# Patient Record
Sex: Male | Born: 1970 | Race: White | Hispanic: No | Marital: Married
Health system: Midwestern US, Community
[De-identification: ages and names within clinical notes are randomized; demographics above are authoritative.]

## PROBLEM LIST (undated history)

## (undated) DIAGNOSIS — F419 Anxiety disorder, unspecified: Secondary | ICD-10-CM

## (undated) DIAGNOSIS — I10 Essential (primary) hypertension: Secondary | ICD-10-CM

## (undated) DIAGNOSIS — G4733 Obstructive sleep apnea (adult) (pediatric): Secondary | ICD-10-CM

## (undated) DIAGNOSIS — R058 Other specified cough: Secondary | ICD-10-CM

## (undated) DIAGNOSIS — R600 Localized edema: Secondary | ICD-10-CM

## (undated) HISTORY — DX: Essential (primary) hypertension: I10

## (undated) HISTORY — DX: Anxiety disorder, unspecified: F41.9

---

## 1997-08-28 ENCOUNTER — Emergency Department (HOSPITAL_COMMUNITY): Admission: EM | Admit: 1997-08-28 | Discharge: 1997-08-28 | Payer: Self-pay

## 2000-01-31 ENCOUNTER — Emergency Department (HOSPITAL_COMMUNITY): Admission: EM | Admit: 2000-01-31 | Discharge: 2000-01-31 | Payer: Self-pay | Admitting: Emergency Medicine

## 2000-06-05 ENCOUNTER — Ambulatory Visit (HOSPITAL_BASED_OUTPATIENT_CLINIC_OR_DEPARTMENT_OTHER): Admission: RE | Admit: 2000-06-05 | Discharge: 2000-06-05 | Payer: Self-pay | Admitting: Surgery

## 2003-11-30 ENCOUNTER — Ambulatory Visit (HOSPITAL_BASED_OUTPATIENT_CLINIC_OR_DEPARTMENT_OTHER): Admission: RE | Admit: 2003-11-30 | Discharge: 2003-11-30 | Payer: Self-pay | Admitting: Otolaryngology

## 2011-04-13 MED ORDER — LISINOPRIL-HYDROCHLOROTHIAZIDE 20 MG-12.5 MG TAB
ORAL_TABLET | Freq: Every day | ORAL | Status: DC
Start: 2011-04-13 — End: 2011-06-08

## 2011-04-13 NOTE — Patient Instructions (Addendum)
[facility]  Blood pressure machine.        DASH Diet: After Your Visit  Your Care Instructions  The DASH diet is an eating plan that can help lower your blood pressure. DASH stands for Dietary Approaches to Stop Hypertension. Hypertension is high blood pressure.  The DASH diet focuses on eating foods that are high in calcium, potassium, and magnesium. These nutrients can lower blood pressure. The foods that are highest in these nutrients are fruits, vegetables, low-fat dairy products, nuts, seeds, and legumes. But taking calcium, potassium, and magnesium supplements instead of eating foods that are high in those nutrients does not have the same effect. The DASH diet also includes whole grains, fish, and poultry.  The DASH diet is one of several lifestyle changes your doctor may recommend to lower your high blood pressure. Your doctor may also want you to decrease the amount of sodium in your diet. Lowering sodium while following the DASH diet can lower blood pressure even further than just the DASH diet alone.  Follow-up care is a key part of your treatment and safety. Be sure to make and go to all appointments, and call your doctor if you are having problems. It???s also a good idea to know your test results and keep a list of the medicines you take.  How can you care for yourself at home?  Following the DASH diet  ?? Eat 4 to 5 servings of fruit each day. A serving is 1 medium-sized piece of fruit, ?? cup chopped or canned fruit, 1/4 cup dried fruit, or 4 ounces (?? cup) of fruit juice. Choose fruit more often than fruit juice.   ?? Eat 4 to 5 servings of vegetables each day. A serving is 1 cup of lettuce or raw leafy vegetables, ?? cup of chopped or cooked vegetables, or 4 ounces (?? cup) of vegetable juice. Choose vegetables more often than vegetable juice.   ?? Get 2 to 3 servings of low-fat and fat-free dairy each day. A serving is 8 ounces of milk, 1 cup of yogurt, or 1 ?? ounces of cheese.   ?? Eat 7 to 8 servings of grains  each day. A serving is 1 slice of bread, 1 ounce of dry cereal, or ?? cup of cooked rice, pasta, or cooked cereal. Try to choose whole-grain products as much as possible.   ?? Limit lean meat, poultry, and fish to 6 ounces each day. Six ounces is about the size of two decks of cards.   ?? Eat 4 to 5 servings of nuts, seeds, and legumes (cooked dried beans, lentils, and split peas) each week. A serving is 1/3 cup of nuts, 2 tablespoons of seeds, or ?? cup cooked dried beans or peas.   ?? Limit sweets and added sugars to 5 servings or less a week. A serving is 1 tablespoon jelly or jam, ?? cup sorbet, or 1 cup of lemonade.   Tips for success  ?? Start small. Do not try to make dramatic changes to your diet all at once. You might feel that you are missing out on your favorite foods and then be more likely to not follow the plan. Make small changes, and stick with them. Once those changes become habit, add a few more changes.   ?? Try some of the following:   ?? Make it a goal to eat a fruit or vegetable at every meal and at snacks. This will make it easy to get the recommended amount of  fruits and vegetables each day.   ?? Try yogurt topped with fruit and nuts for a snack or healthy dessert.   ?? Add lettuce, tomato, cucumber, and onion to sandwiches.   ?? Combine a ready-made pizza crust with low-fat mozzarella cheese and lots of vegetable toppings. Try using tomatoes, squash, spinach, broccoli, carrots, cauliflower, and onions.   ?? Have a variety of cut-up vegetables with a low-fat dip as an appetizer instead of chips and dip.   ?? Sprinkle sunflower seeds or chopped almonds over salads. Or try adding chopped walnuts or almonds to cooked vegetables.   ?? Try some vegetarian meals using beans and peas. Add garbanzo or kidney beans to salads. Make burritos and tacos with mashed pinto beans or black beans.     Where can you learn more?    Go to [email or web address]   Enter H967 in the search box to learn more about  "DASH Diet: After Your Visit."    ?? 2006-2013 [employer]. Care instructions adapted under license by [employer] (which disclaims liability or warranty for this information). This care instruction is for use with your licensed healthcare professional. If you have questions about a medical condition or this instruction, always ask your healthcare professional. [employer] disclaims any warranty or liability for your use of this information.  Content Version: 9.6.101520; Last Revised: April 07, 2010          High Blood Pressure: After Your Visit  Your Care Instructions  If your blood pressure is usually above 140/90, you have high blood pressure, or hypertension. Despite what a lot of people think, high blood pressure usually doesn't cause headaches or make you feel dizzy or lightheaded. It usually has no symptoms. But it does increase your risk for heart attack, stroke, and kidney or eye damage. The higher your blood pressure, the more your risk increases.  Your doctor will give you a goal for your blood pressure. This goal may be below 140/90. Or it may be even lower if you have other health problems, such as diabetes, heart failure, or coronary artery disease.  Changes in your lifestyle, such as staying at a healthy weight, may help you lower your blood pressure. Your treatment also will include medicine. If you stop taking your medicine, your blood pressure will go back up.  Follow-up care is a key part of your treatment and safety. Be sure to make and go to all appointments, and call your doctor if you are having problems. It???s also a good idea to know your test results and keep a list of the medicines you take.  How can you care for yourself at home?  Medical treatment  ?? Take your medicine exactly as prescribed. You may take one or more types of medicine to lower your blood pressure. They include diuretics, beta-blockers, ACE inhibitors, calcium channel blockers, angiotensin II  receptor blockers, and other medicines. Call your doctor if you think you are having a problem with your medicine.   ?? Your doctor may suggest that you take one low-dose aspirin (81 mg) a day. This can help reduce your risk of having a stroke or heart attack.   ?? See your doctor at least 2 times a year. You may need to see the doctor more often at first or until your blood pressure comes down.   ?? If you are taking blood pressure medicine, talk to your doctor before you take decongestants or anti-inflammatory medicine, such as ibuprofen. Some of these medicines  can raise blood pressure.   ?? Learn how to check your blood pressure at home.   Lifestyle changes  ?? Stay at a healthy weight. This is especially important if you put on weight around the waist. Losing even 10 pounds can help you lower your blood pressure.   ?? If your doctor recommends it, get more exercise. Walking is a good choice. Bit by bit, increase the amount you walk every day. Try for at least 30 minutes on most days of the week. You also may want to swim, bike, or do other activities.   ?? Avoid or limit alcohol. Talk to your doctor about whether you can drink any alcohol.   ?? Limit salt.   ?? Eat plenty of fruits (such as bananas and oranges), vegetables, legumes, whole grains, and low-fat dairy products.   ?? Lower the amount of saturated fat in your diet. Saturated fat is found in animal products such as milk, cheese, and meat. Limiting these foods may help you lose weight and also lower your risk for heart disease.   ?? Do not smoke. Smoking increases your risk for heart attack and stroke. If you need help quitting, talk to your doctor about stop-smoking programs and medicines. These can increase your chances of quitting for good.   When should you call for help?  Call your doctor now or seek immediate medical care if:  ?? Your blood pressure is much higher than normal (such as 180/110 or higher).   ?? You think high blood pressure is causing symptoms  such as:   ?? Severe headache.   ?? Blurry vision.   ?? Nausea or vomiting.   Watch closely for changes in your health, and be sure to contact your doctor if:  ?? You do not get better as expected.     Where can you learn more?    Go to [email or web address]   Enter [id number] in the search box to learn more about "High Blood Pressure: After Your Visit."    ?? 2006-2013 [employer]. Care instructions adapted under license by [employer] (which disclaims liability or warranty for this information). This care instruction is for use with your licensed healthcare professional. If you have questions about a medical condition or this instruction, always ask your healthcare professional. [employer] disclaims any warranty or liability for your use of this information.  Content Version: 9.6.101520; Last Revised: May 18, 2009

## 2011-04-13 NOTE — Progress Notes (Signed)
Establish care/patient had uvulectomy surgery

## 2011-04-13 NOTE — Progress Notes (Signed)
HISTORY OF PRESENT ILLNESS  Daryl Morgan is a 41 y.o. male.  HPI  new to my practice  Transferring care from Dr. [place].  Last evaluated there 12 months ago.    Hypertension:  Daryl Morgan is a 41 y.o. male with hypertension.    Medication change since last visit: Yes: Comment: started lisinopril/hct  The patient reports:  taking medications as instructed, no medication side effects noted, patient does not perform home BP monitoring, no chest pain on exertion, no dyspnea on exertion, no swelling of ankles, no orthostatic dizziness or lightheadedness, no palpitations.       Lifestyle modification/social history: sedentary    No results found for this basename: NA, K, CL, CO2, AGAP, GLU, BUN, CREA, BUCR, GFRAA, GFRNA, CA, KP, CLP, CO2P, GLUC, BUNPL, CREAP, GFRAA, CAPL       Anxiety history - on citalopram for panic disorder.  Working well.  No side effects.      Patient Active Problem List   Diagnoses Code   ??? HTN (hypertension) 401.9   ??? Anxiety 300.00     Past Surgical History   Procedure Date   ??? Hx uvulopalatopharyngoplasty    ??? Hx uvulopalatopharyngoplasty    ??? Hx heent      uvuloectomy     History     Social History   ??? Marital Status: MARRIED     Spouse Name: N/A     Number of Children: N/A   ??? Years of Education: N/A     Occupational History   ??? Not on file.     Social History Main Topics   ??? Smoking status: Current Some Day Smoker     Types: Cigarettes, Cigars   ??? Smokeless tobacco: Never Used    Comment: 1/pp week   ??? Alcohol Use: 5.0 oz/week     10 Shots of liquor per week   ??? Drug Use: Yes     Special: Hallucinogenics, Marijuana, Cocaine      NO IVDA,  drugs in past only   ??? Sexually Active: Yes -- Male partner(s)     Other Topics Concern   ??? Not on file     Social History Narrative   ??? No narrative on file     Family History   Problem Relation Age of Onset   ??? Cancer Maternal Grandmother    ??? Diabetes Neg Hx    ??? Hypertension Neg Hx      Current Outpatient Prescriptions   Medication Sig   ???  citalopram (CELEXA) 20 mg tablet Take  by mouth daily.   ??? lisinopril-hydrochlorothiazide (PRINZIDE, ZESTORETIC) 20-12.5 mg per tablet Take 2 Tabs by mouth daily.   ??? DISCONTD: lisinopril-hydrochlorothiazide (PRINZIDE, ZESTORETIC) 10-12.5 mg per tablet Take  by mouth daily.   ??? DISCONTD: lisinopril-hydrochlorothiazide (PRINZIDE, ZESTORETIC) 20-12.5 mg per tablet Take  by mouth daily.     No Known Allergies      Review of Systems   Constitutional: Negative for malaise/fatigue.   Respiratory: Negative for shortness of breath.    Cardiovascular: Negative for chest pain, palpitations and leg swelling.   Gastrointestinal: Negative.    Musculoskeletal: Negative for myalgias.        No Muscle cramping   Neurological: Negative for dizziness, focal weakness, loss of consciousness, weakness and headaches.   Endo/Heme/Allergies: Negative for environmental allergies.   Psychiatric/Behavioral: Negative for depression, suicidal ideas and substance abuse. The patient is not nervous/anxious.        Physical  Exam   Nursing note and vitals reviewed.  Constitutional: He appears well-developed and well-nourished. No distress.        BP 146/110   Pulse 70   Temp(Src) 98.9 ??F (37.2 ??C) (Oral)   Resp 20   Ht 5' 10.75" (1.797 m)   Wt 286 lb 3.2 oz (129.819 kg)   BMI 40.20 kg/m2   SpO2 97%Body mass index is 40.20 kg/(m^2).   HENT:   Mouth/Throat: Oropharynx is clear and moist.   Neck: No JVD present. Carotid bruit is not present.   Cardiovascular: Normal rate, regular rhythm, normal heart sounds and intact distal pulses.    Pulmonary/Chest: Effort normal and breath sounds normal.   Musculoskeletal: He exhibits no edema.   Neurological: He is alert.   Skin: Skin is warm and dry. He is not diaphoretic.   Psychiatric: He has a normal mood and affect. His behavior is normal. Judgment and thought content normal.     Repeat vitals were done by me and were:  BP 148/100   Pulse 70   Temp(Src) 98.9 ??F (37.2 ??C) (Oral)   Resp 20   Ht 5' 10.75" (1.797  m)   Wt 286 lb 3.2 oz (129.819 kg)   BMI 40.20 kg/m2   SpO2 97%      ASSESSMENT and PLAN  Nana was seen today for establish care.    Diagnoses and associated orders for this visit:    Hypertension, uncontrolled- increase ACE/ thiazide now.   Log blood pressures at home 3-5 times weekly and bring to next visit.  Call office as soon as possible if BP's over 150/100 on multiple occasions or with symptoms of dizziness, chest pain, shortness of breath, headache or ankle swelling.    - Discontinue: lisinopril-hydrochlorothiazide (PRINZIDE, ZESTORETIC) 20-12.5 mg per tablet; Take  by mouth daily.  - lisinopril-hydrochlorothiazide (PRINZIDE, ZESTORETIC) 20-12.5 mg per tablet; Take 2 Tabs by mouth daily.  - METABOLIC PANEL, BASIC  The patient was given written instructions detailing his diagnoses and recommendations made today at the visit.    Anxiety- great control- no changes.  - citalopram (CELEXA) 20 mg tablet; Take  by mouth daily.    Other Orders  - Discontinue: lisinopril-hydrochlorothiazide (PRINZIDE, ZESTORETIC) 10-12.5 mg per tablet; Take  by mouth daily.    The patient is advised to quit smoking, begin progressive daily aerobic exercise program, follow a low fat, low cholesterol diet, attempt to lose weight and reduce salt in diet and cooking.      Follow-up Disposition:  Return in about 4 weeks (around 05/11/2011).  lab results and schedule of future lab studies reviewed with patient  reviewed diet, exercise and weight control  very strongly urged to quit smoking to reduce cardiovascular risk  reviewed medications and side effects in detail

## 2011-04-14 LAB — METABOLIC PANEL, BASIC
BUN/Creatinine ratio: 13 (ref 9–20)
BUN: 12 mg/dL (ref 6–24)
CO2: 24 mmol/L (ref 20–32)
Calcium: 9.9 mg/dL (ref 8.7–10.2)
Chloride: 104 mmol/L (ref 97–108)
Creatinine: 0.9 mg/dL (ref 0.76–1.27)
GFR est non-AA: 105 mL/min/{1.73_m2} (ref >59)
Glucose: 76 mg/dL (ref 65–99)
Potassium: 4.3 mmol/L (ref 3.5–5.2)
Sodium: 140 mmol/L (ref 134–144)
eGFR If African American: 121 mL/min/{1.73_m2} (ref >59)

## 2011-05-11 MED ORDER — AMLODIPINE 5 MG TAB
5 mg | ORAL_TABLET | Freq: Every day | ORAL | Status: DC
Start: 2011-05-11 — End: 2011-06-08

## 2011-05-11 NOTE — Progress Notes (Signed)
HISTORY OF PRESENT ILLNESS  Daryl Morgan is a 41 y.o. male.  HPI  Hypertension:  Daryl Morgan is a 42 y.o. male with hypertension.    Medication change since last visit: Yes: Comment: increased lisinopril and hctz  The patient reports:  taking medications as instructed, no medication side effects noted, patient does not perform home BP monitoring, no TIA's, no chest pain on exertion, no dyspnea on exertion, no swelling of ankles, no orthostatic dizziness or lightheadedness, no palpitations.       Lifestyle modification/social history: not attempting to follow a low fat, low cholesterol diet, sedentary, nonsmoker    Lab Results   Component Value Date/Time    Sodium 140 04/13/2011 12:00 AM    Potassium 4.3 04/13/2011 12:00 AM    Chloride 104 04/13/2011 12:00 AM    CO2 24 04/13/2011 12:00 AM    Glucose 76 04/13/2011 12:00 AM    BUN 12 04/13/2011 12:00 AM    Creatinine 0.90 04/13/2011 12:00 AM    BUN/Creatinine ratio 13 04/13/2011 12:00 AM    GFR est non-AA 105 04/13/2011 12:00 AM    Calcium 9.9 04/13/2011 12:00 AM             ROS    Physical Exam   Nursing note and vitals reviewed.  Constitutional: He appears well-developed and well-nourished. No distress.        BP 135/100   Pulse 80   Temp(Src) 98.8 ??F (37.1 ??C) (Oral)   Resp 20   Ht 5' 10.7" (1.796 m)   Wt 291 lb 9.6 oz (132.269 kg)   BMI 41.02 kg/m2   SpO2 96%Body mass index is 41.02 kg/(m^2).   HENT:   Mouth/Throat: Oropharynx is clear and moist.   Neck: No JVD present. Carotid bruit is not present.   Cardiovascular: Normal rate, regular rhythm, normal heart sounds and intact distal pulses.    Pulmonary/Chest: Effort normal and breath sounds normal.   Musculoskeletal: He exhibits no edema.   Neurological: He is alert.   Skin: Skin is warm and dry. He is not diaphoretic.       ASSESSMENT and PLAN  Daryl Morgan was seen today for hypertension.    Diagnoses and associated orders for this visit:    Uncontrolled hypertension- add CCB  - amLODIPine (NORVASC) 5 mg tablet; Take 1 Tab by  mouth daily.  The patient is advised to begin progressive daily aerobic exercise program, follow a low fat, low cholesterol diet and attempt to lose weight.        Follow-up Disposition:  Return in about 4 weeks (around 06/08/2011).  lab results and schedule of future lab studies reviewed with patient

## 2011-06-08 MED ORDER — AMLODIPINE 5 MG TAB
5 mg | ORAL_TABLET | Freq: Every day | ORAL | Status: DC
Start: 2011-06-08 — End: 2012-01-09

## 2011-06-08 MED ORDER — LISINOPRIL-HYDROCHLOROTHIAZIDE 20 MG-12.5 MG TAB
ORAL_TABLET | Freq: Every day | ORAL | Status: DC
Start: 2011-06-08 — End: 2012-01-09

## 2011-06-08 NOTE — Progress Notes (Signed)
HISTORY OF PRESENT ILLNESS  Daryl Morgan is a 41 y.o. male.  HPI  Hypertension:  Daryl Morgan is a 41 y.o. male with hypertension.    Medication change since last visit: Yes: Comment: added amlodipine  The patient reports:  taking medications as instructed, no medication side effects noted, patient does not perform home BP monitoring, no TIA's, no chest pain on exertion, no dyspnea on exertion, no swelling of ankles, no orthostatic dizziness or lightheadedness, no palpitations.       Lifestyle modification/social history: generally follows a low fat low cholesterol diet, generally follows a low sodium diet, sedentary    Lab Results   Component Value Date/Time    Sodium 140 04/13/2011 12:00 AM    Potassium 4.3 04/13/2011 12:00 AM    Chloride 104 04/13/2011 12:00 AM    CO2 24 04/13/2011 12:00 AM    Glucose 76 04/13/2011 12:00 AM    BUN 12 04/13/2011 12:00 AM    Creatinine 0.90 04/13/2011 12:00 AM    BUN/Creatinine ratio 13 04/13/2011 12:00 AM    GFR est non-AA 105 04/13/2011 12:00 AM    Calcium 9.9 04/13/2011 12:00 AM       Anxiety :  Patient is seen for anxiety disorder. Current treatment includes Celexa and no other therapies. Reported side effects from the medication: none.  Ongoing symptoms include: none.   he does feel treatment is effective.  Patient denies: none.  Depression is not associated with his anxiety.        ROS    Physical Exam  BP 126/86   Temp 98.9 ??F (37.2 ??C)   Resp 18   Ht 6' (1.829 m)   Wt 289 lb (131.09 kg)   BMI 39.20 kg/m2   SpO2 99%    ASSESSMENT and PLAN  Daryl Morgan was seen today for follow-up and blood pressure check.    Diagnoses and associated orders for this visit:    Htn (hypertension)- much better control.   Log blood pressures at home 3-5 times weekly and bring to next visit.  Call office as soon as possible if BP's over 150/100 on multiple occasions or with symptoms of dizziness, chest pain, shortness of breath, headache or ankle swelling.    - LIPID PANEL  - amLODIPine (NORVASC) 5 mg tablet; Take  1 Tab by mouth daily.  - lisinopril-hydrochlorothiazide (PRINZIDE, ZESTORETIC) 20-12.5 mg per tablet; Take 2 Tabs by mouth daily.  The patient is advised to begin progressive daily aerobic exercise program.    Anxiety- reported good control with SSRI.  No changes.      Follow-up Disposition:  Return in about 4 months (around 10/09/2011).  lab results and schedule of future lab studies reviewed with patient  reviewed medications and side effects in detail

## 2011-06-09 LAB — LIPID PANEL
Cholesterol, total: 170 mg/dL (ref 100–199)
HDL Cholesterol: 45 mg/dL (ref >39)
LDL, calculated: 95 mg/dL (ref 0–99)
Triglyceride: 152 mg/dL — ABNORMAL HIGH (ref 0–149)
VLDL, calculated: 30 mg/dL (ref 5–40)

## 2011-10-18 NOTE — Progress Notes (Signed)
Patient here for F/up for HTN/eye problem.

## 2011-10-18 NOTE — Progress Notes (Signed)
HISTORY OF PRESENT ILLNESS  Daryl Morgan is a 41 y.o. male.  HPI  Hypertension:  Daryl Morgan is a 41 y.o. male with hypertension.    Medication change since last visit: No  The patient reports:  taking medications as instructed, no medication side effects noted, patient does not perform home BP monitoring, no TIA's, no chest pain on exertion, no dyspnea on exertion, no swelling of ankles, no orthostatic dizziness or lightheadedness, no palpitations.       Lifestyle modification/social history: not attempting to follow a low fat, low cholesterol diet, not attempting to follow a low sodium diet, exercises sporadically, nonsmoker    Lab Results   Component Value Date/Time    Sodium 140 04/13/2011 12:00 AM    Potassium 4.3 04/13/2011 12:00 AM    Chloride 104 04/13/2011 12:00 AM    CO2 24 04/13/2011 12:00 AM    Glucose 76 04/13/2011 12:00 AM    BUN 12 04/13/2011 12:00 AM    Creatinine 0.90 04/13/2011 12:00 AM    BUN/Creatinine ratio 13 04/13/2011 12:00 AM    GFR est non-AA 105 04/13/2011 12:00 AM    Calcium 9.9 04/13/2011 12:00 AM         Anxiety - doing well with SSRI.    Problem visit:  Daryl Morgan is here for complaint of bilateral eye redness, whitish mucous discharge.  Problem began 3 week(s) ago.  Severity is mild  Character of problem: constant   nothing makes the problem worse.  nothing makes the problem better.  Associated symptoms include: no gluing. No uri or sinus allergy symptoms  Treatments tried include: medication not used    Patient Active Problem List   Diagnosis Code   ??? HTN (hypertension) 401.9   ??? Anxiety 300.00     Past Medical History   Diagnosis Date   ??? Hypertension      No Known Allergies  Current Outpatient Prescriptions on File Prior to Visit   Medication Sig Dispense Refill   ??? amLODIPine (NORVASC) 5 mg tablet Take 1 Tab by mouth daily.  30 Tab  5   ??? lisinopril-hydrochlorothiazide (PRINZIDE, ZESTORETIC) 20-12.5 mg per tablet Take 2 Tabs by mouth daily.  60 Tab  5     History   Substance Use Topics    ??? Smoking status: Current Some Day Smoker     Types: Cigarettes, Cigars   ??? Smokeless tobacco: Never Used    Comment: 1/pp week   ??? Alcohol Use: 5.0 oz/week     10 Shots of liquor per week             ROS    Physical Exam   Nursing note and vitals reviewed.  Constitutional: He appears well-developed and well-nourished. No distress.        BP 137/98   Pulse 77   Temp 98.1 ??F (36.7 ??C) (Oral)   Resp 16   Ht 6' 1.8" (1.875 m)   Wt 284 lb 6.4 oz (129.003 kg)   BMI 36.71 kg/m2   SpO2 97%Body mass index is 36.71 kg/(m^2).   HENT:   Mouth/Throat: Oropharynx is clear and moist.   Eyes: EOM and lids are normal. Pupils are equal, round, and reactive to light. Right eye exhibits no discharge and no exudate. Left eye exhibits no discharge and no exudate. Right conjunctiva is injected. Right conjunctiva has no hemorrhage. Left conjunctiva is injected. Left conjunctiva has no hemorrhage.   Neck: No JVD present. Carotid bruit is not present.  Cardiovascular: Normal rate, regular rhythm, normal heart sounds and intact distal pulses.    Pulmonary/Chest: Effort normal and breath sounds normal.   Musculoskeletal: He exhibits no edema.   Lymphadenopathy:        Head (right side): No preauricular adenopathy present.        Head (left side): No preauricular adenopathy present.   Neurological: He is alert.   Skin: Skin is warm and dry. He is not diaphoretic.   Psychiatric: He has a normal mood and affect. His speech is normal and behavior is normal. Judgment and thought content normal. Cognition and memory are normal.       ASSESSMENT and PLAN  Daryl Morgan was seen today for hypertension and eye problem.    Diagnoses and associated orders for this visit:    Htn (hypertension) - fair control.  Check at home.  Actively losing wt.  Ramp up exercise.   Log blood pressures at home 3-5 times weekly and bring to next visit.  Call office as soon as possible if BP's over 150/100 on multiple occasions or with symptoms of dizziness, chest pain, shortness  of breath, headache or ankle swelling.    Anxiety - great control  - citalopram (CELEXA) 20 mg tablet; Take 1 Tab by mouth daily.    Allergic conjunctivitis of both eyes  - ketotifen (ZADITOR) 0.025 % ophthalmic solution; Administer 2 Drops to both eyes two (2) times a day.        Follow-up Disposition:  Return in about 6 months (around 04/16/2012).  lab results and schedule of future lab studies reviewed with patient  reviewed medications and side effects in detail

## 2011-10-30 NOTE — Telephone Encounter (Signed)
Message copied by Hazeline Junker on Mon Oct 30, 2011  7:33 AM  ------       Message from: Gloriajean Dell       Created: Fri Oct 27, 2011  6:20 PM       Regarding: Dr. Rogers/ telephone         The patient's eye is still not doing well, and he would like something called in for pink eye.  [facility] [address]  If needed the patient can be reached at [phone].

## 2011-10-30 NOTE — Telephone Encounter (Signed)
Pt denies the eyes being crusted or glued shut. He states he has excessive mucus and reddened, itchy eyes. [facility] not [facility] would you prefer to see this pt. I will call him.

## 2011-10-30 NOTE — Telephone Encounter (Signed)
He has viral vs allergic conjunctivitis.  Would try otc eye drop like visine and zatidor drops first and let me know if not improving.

## 2011-10-30 NOTE — Telephone Encounter (Signed)
Pt reports conjunctivitis both eyes. When asked pt states throughout the day there is a lot of mucus build in both eyes has [facility]. Eyes are itchy and reddened. Pt would rx sent to local pharm on file.

## 2011-10-30 NOTE — Telephone Encounter (Signed)
Pt was given message as per pcp.

## 2011-10-30 NOTE — Telephone Encounter (Signed)
otc visine is ok.  Will need evaluation in office if not improving or if eyelids "glued shut" at times.

## 2012-01-12 NOTE — Telephone Encounter (Signed)
Message copied by Gardiner Rhyme on Fri Jan 12, 2012 10:40 AM  ------       Message from: Starr Lake       Created: Fri Jan 12, 2012 10:31 AM       Regarding: Dr Rogers/refill         Pt requesting a refill for Lisinopril and Amlodipine [facility] [phone]. Pt can be reached [phone]. Pt completely out of medication need today.  ------

## 2012-01-12 NOTE — Telephone Encounter (Signed)
Message copied by Joycelyn Schmid on Fri Jan 12, 2012 10:42 AM  ------       Message from: Starr Lake       Created: Fri Jan 12, 2012 10:31 AM       Regarding: Dr Rogers/refill         Pt requesting a refill for Lisinopril and Amlodipine [facility] [phone]. Pt can be reached [phone]. Pt completely out of medication need today.  ------

## 2012-01-12 NOTE — Telephone Encounter (Signed)
Done. Pharmacy has spoken with pt.

## 2012-01-12 NOTE — Telephone Encounter (Signed)
Done.

## 2012-01-25 ENCOUNTER — Encounter

## 2012-01-25 NOTE — Telephone Encounter (Signed)
rxs printed for 30 day each, as requested

## 2012-01-25 NOTE — Telephone Encounter (Signed)
Requested Prescriptions     Pending Prescriptions Disp Refills   ??? amLODIPine (NORVASC) 5 mg tablet 30 Tab 5   ??? citalopram (CELEXA) 20 mg tablet 30 Tab 5     Sig: Take 1 Tab by mouth daily.   ??? lisinopril-hydrochlorothiazide (PRINZIDE, ZESTORETIC) 20-12.5 mg per tablet 60 Tab 5     Send hard copy to his home address

## 2013-03-21 ENCOUNTER — Encounter

## 2013-03-31 MED ORDER — AMLODIPINE 10 MG TAB
10 mg | ORAL_TABLET | Freq: Every day | ORAL | Status: DC
Start: 2013-03-31 — End: 2014-01-22

## 2013-03-31 MED ORDER — CITALOPRAM 20 MG TAB
20 mg | ORAL_TABLET | Freq: Every day | ORAL | Status: DC
Start: 2013-03-31 — End: 2014-01-22

## 2013-03-31 MED ORDER — LISINOPRIL-HYDROCHLOROTHIAZIDE 20 MG-12.5 MG TAB
ORAL_TABLET | ORAL | Status: DC
Start: 2013-03-31 — End: 2014-01-22

## 2013-03-31 NOTE — Patient Instructions (Signed)
DASH Diet: After Your Visit  Your Care Instructions  The DASH diet is an eating plan that can help lower your blood pressure. DASH stands for Dietary Approaches to Stop Hypertension. Hypertension is high blood pressure.  The DASH diet focuses on eating foods that are high in calcium, potassium, and magnesium. These nutrients can lower blood pressure. The foods that are highest in these nutrients are fruits, vegetables, low-fat dairy products, nuts, seeds, and legumes. But taking calcium, potassium, and magnesium supplements instead of eating foods that are high in those nutrients does not have the same effect. The DASH diet also includes whole grains, fish, and poultry.  The DASH diet is one of several lifestyle changes your doctor may recommend to lower your high blood pressure. Your doctor may also want you to decrease the amount of sodium in your diet. Lowering sodium while following the DASH diet can lower blood pressure even further than just the DASH diet alone.  Follow-up care is a key part of your treatment and safety. Be sure to make and go to all appointments, and call your doctor if you are having problems. It's also a good idea to know your test results and keep a list of the medicines you take.  How can you care for yourself at home?  Following the DASH diet  ?? Eat 4 to 5 servings of fruit each day. A serving is 1 medium-sized piece of fruit, ?? cup chopped or canned fruit, 1/4 cup dried fruit, or 4 ounces (?? cup) of fruit juice. Choose fruit more often than fruit juice.  ?? Eat 4 to 5 servings of vegetables each day. A serving is 1 cup of lettuce or raw leafy vegetables, ?? cup of chopped or cooked vegetables, or 4 ounces (?? cup) of vegetable juice. Choose vegetables more often than vegetable juice.  ?? Get 2 to 3 servings of low-fat and fat-free dairy each day. A serving is 8 ounces of milk, 1 cup of yogurt, or 1 ?? ounces of cheese.  ?? Eat 6 to 8 servings of grains each day. A serving is 1 slice of  bread, 1 ounce of dry cereal, or ?? cup of cooked rice, pasta, or cooked cereal. Try to choose whole-grain products as much as possible.  ?? Limit lean meat, poultry, and fish to 2 servings each day. A serving is 3 ounces, about the size of a deck of cards.  ?? Eat 4 to 5 servings of nuts, seeds, and legumes (cooked dried beans, lentils, and split peas) each week. A serving is 1/3 cup of nuts, 2 tablespoons of seeds, or ?? cup cooked dried beans or peas.  ?? Limit fats and oils to 2 to 3 servings each day. A serving is 1 teaspoon of vegetable oil or 2 tablespoons of salad dressing.  ?? Limit sweets and added sugars to 5 servings or less a week. A serving is 1 tablespoon jelly or jam, ?? cup sorbet, or 1 cup of lemonade.  ?? Eat less than 2,300 milligrams (mg) of sodium a day. If you have high blood pressure, diabetes, or chronic kidney disease, if you are African-American, or if you are older than age 44, try to limit the amount of sodium you eat to less than 1,500 mg a day.  Tips for success  ?? Start small. Do not try to make dramatic changes to your diet all at once. You might feel that you are missing out on your favorite foods and then be more   likely to not follow the plan. Make small changes, and stick with them. Once those changes become habit, add a few more changes.  ?? Try some of the following:  ?? Make it a goal to eat a fruit or vegetable at every meal and at snacks. This will make it easy to get the recommended amount of fruits and vegetables each day.  ?? Try yogurt topped with fruit and nuts for a snack or healthy dessert.  ?? Add lettuce, tomato, cucumber, and onion to sandwiches.  ?? Combine a ready-made pizza crust with low-fat mozzarella cheese and lots of vegetable toppings. Try using tomatoes, squash, spinach, broccoli, carrots, cauliflower, and onions.  ?? Have a variety of cut-up vegetables with a low-fat dip as an appetizer instead of chips and dip.  ?? Sprinkle sunflower seeds or chopped almonds over  salads. Or try adding chopped walnuts or almonds to cooked vegetables.  ?? Try some vegetarian meals using beans and peas. Add garbanzo or kidney beans to salads. Make burritos and tacos with mashed pinto beans or black beans.   Where can you learn more?   Go to http://www.healthwise.net/[facility]  Enter H967 in the search box to learn more about "DASH Diet: After Your Visit."   ?? 2006-2014 [employer]. Care instructions adapted under license by [facility] (which disclaims liability or warranty for this information). This care instruction is for use with your licensed healthcare professional. If you have questions about a medical condition or this instruction, always ask your healthcare professional. Healthwise, Incorporated disclaims any warranty or liability for your use of this information.  Content Version: 10.2.346038; Current as of: April 03, 2012

## 2013-03-31 NOTE — Progress Notes (Signed)
HISTORY OF PRESENT ILLNESS  Daryl Morgan is a 43 y.o. male.  HPI     Not seen in over 2 years.  Hypertension:  Daryl Morgan is a 43 y.o. male with hypertension.    Medication change since last visit: No  The patient reports:  taking medications as instructed, no medication side effects noted, patient does not perform home BP monitoring, no chest pain on exertion, no dyspnea on exertion, no swelling of ankles, no orthostatic dizziness or lightheadedness, no palpitations.       Lifestyle modification/social history: not attempting to follow a low fat, low cholesterol diet, not attempting to follow a low sodium diet, exercises sporadically, smoker social smoker    Lab Results   Component Value Date/Time    Sodium 140 04/13/2011 12:00 AM    Potassium 4.3 04/13/2011 12:00 AM    Chloride 104 04/13/2011 12:00 AM    CO2 24 04/13/2011 12:00 AM    Glucose 76 04/13/2011 12:00 AM    BUN 12 04/13/2011 12:00 AM    Creatinine 0.90 04/13/2011 12:00 AM    BUN/Creatinine ratio 13 04/13/2011 12:00 AM    GFR est non-AA 105 04/13/2011 12:00 AM    Calcium 9.9 04/13/2011 12:00 AM     Hx of anxiety.  On citalopram.  He reports great result with ongoing dosing.  No breakthru symptoms.  Good function.  No depression.    Seeing Dr. Addison Morgan at [facility] ortho for L neck to shoulder pain.  Placed on indocin high dose.  To follow up soon with him.    Patient Active Problem List   Diagnosis Code   ??? HTN (hypertension) 401.9   ??? Anxiety 300.00     Past Medical History   Diagnosis Date   ??? Hypertension      No Known Allergies  Current Outpatient Prescriptions on File Prior to Visit   Medication Sig Dispense Refill   ??? ketotifen (ZADITOR) 0.025 % ophthalmic solution Administer 2 Drops to both eyes two (2) times a day.         No current facility-administered medications on file prior to visit.     History   Substance Use Topics   ??? Smoking status: Current Some Day Smoker     Types: Cigarettes, Cigars   ??? Smokeless tobacco: Never Used      Comment: 1/pp week   ???  Alcohol Use: 5.0 oz/week     10 Shots of liquor per week              Review of Systems   Constitutional: Negative for malaise/fatigue.   Respiratory: Negative for shortness of breath.    Cardiovascular: Negative for chest pain, palpitations and leg swelling.   Musculoskeletal: Positive for joint pain. Negative for myalgias.        No Muscle cramping   Neurological: Negative for dizziness, focal weakness, loss of consciousness, weakness and headaches.       Physical Exam   Constitutional: He appears well-developed and well-nourished. No distress.   BP 129/91    Pulse 80    Temp(Src) 98.8 ??F (37.1 ??C) (Oral)    Resp 18    Ht 6' 1.8" (1.875 m)    Wt 293 lb 4 oz (133.017 kg)    BMI 37.84 kg/m2      SpO2 97% Body mass index is 37.84 kg/(m^2).   HENT:   Mouth/Throat: Oropharynx is clear and moist.   Neck: No JVD present. Carotid bruit is not present.  Cardiovascular: Normal rate, regular rhythm, normal heart sounds and intact distal pulses.    Pulmonary/Chest: Effort normal and breath sounds normal.   Musculoskeletal: He exhibits no edema.   Neurological: He is alert.   Skin: Skin is warm and dry. He is not diaphoretic.   Psychiatric: He has a normal mood and affect. His speech is normal and behavior is normal. Judgment and thought content normal. Cognition and memory are normal.   Nursing note and vitals reviewed.      ASSESSMENT and PLAN  [name] was seen today for hypertension.    Diagnoses and associated orders for this visit:    Hypertension, uncontrolled - increase amlodipine.   Log blood pressures at home 3-5 times weekly and bring to next visit.  Call office as soon as possible if BP's over 140/90 on multiple occasions or with symptoms of dizziness, chest pain, shortness of breath, headache or ankle swelling.  Daryl PotashMark Morgan was given printed general information and instructions regarding hypertension.  he was given instructions on the DASH diet as well.    - lisinopril-hydrochlorothiazide (PRINZIDE, ZESTORETIC)  20-12.5 mg per tablet; TAKE 2 TABLETS BY MOUTH DAILY  - amLODIPine (NORVASC) 10 mg tablet; Take 1 Tab by mouth daily.  - METABOLIC PANEL, BASIC    Anxiety - Well controlled and stable.  his medications were reviewed and refilled where necessary as noted below.  Labs ordered as noted.  - citalopram (CELEXA) 20 mg tablet; Take 1 Tab by mouth daily.    Obese -The patient is advised to begin progressive daily aerobic exercise program, follow a low fat, low cholesterol diet, attempt to lose weight and reduce salt in diet and cooking.      Tobacco dependence - precomtemplative for smoking cessation at this time.    Other Orders  - Cancel: amLODIPine (NORVASC) 5 mg tablet; TAKE 1 TABLET BY MOUTH DAILY        Follow-up Disposition:  Return in about 3 months (around 07/01/2013).  lab results and schedule of future lab studies reviewed with patient  reviewed diet, exercise and weight control  reviewed medications and side effects in detail

## 2013-04-01 LAB — METABOLIC PANEL, BASIC
BUN/Creatinine ratio: 14 (ref 9–20)
BUN: 16 mg/dL (ref 6–24)
CO2: 26 mmol/L (ref 18–29)
Calcium: 9.1 mg/dL (ref 8.7–10.2)
Chloride: 102 mmol/L (ref 97–108)
Creatinine: 1.12 mg/dL (ref 0.76–1.27)
GFR est AA: 92 mL/min/{1.73_m2} (ref >59)
GFR est non-AA: 79 mL/min/{1.73_m2} (ref >59)
Glucose: 86 mg/dL (ref 65–99)
Potassium: 4.2 mmol/L (ref 3.5–5.2)
Sodium: 142 mmol/L (ref 134–144)

## 2014-01-22 ENCOUNTER — Ambulatory Visit
Admit: 2014-01-22 | Discharge: 2014-01-22 | Payer: PRIVATE HEALTH INSURANCE | Attending: Internal Medicine | Primary: Internal Medicine

## 2014-01-22 DIAGNOSIS — I10 Essential (primary) hypertension: Secondary | ICD-10-CM

## 2014-01-22 MED ORDER — LISINOPRIL-HYDROCHLOROTHIAZIDE 20 MG-12.5 MG TAB
ORAL_TABLET | Freq: Every day | ORAL | Status: DC
Start: 2014-01-22 — End: 2014-04-15

## 2014-01-22 MED ORDER — AMLODIPINE 10 MG TAB
10 mg | ORAL_TABLET | Freq: Every day | ORAL | Status: DC
Start: 2014-01-22 — End: 2014-04-15

## 2014-01-22 MED ORDER — CITALOPRAM 20 MG TAB
20 mg | ORAL_TABLET | Freq: Every day | ORAL | Status: DC
Start: 2014-01-22 — End: 2015-01-11

## 2014-01-22 NOTE — Progress Notes (Signed)
HISTORY OF PRESENT ILLNESS  Daryl Morgan is a 43 y.o. male.  HPI  Hypertension:  Daryl PotashMark Punt is a 43 y.o. male with hypertension.    without Chronic kidney disease stage    Medication change since last visit: Yes: Comment: out of meds 1 month  The patient reports:  not taking medications regularly as instructed, home BP monitoring in range of 120's systolic over 80's diastolic, no chest pain on exertion, no dyspnea on exertion, no swelling of ankles, no orthostatic dizziness or lightheadedness, no palpitations.       Lifestyle modification/social history: generally follows a low fat low cholesterol diet, generally follows a low sodium diet, sedentary, nonsmoker    Lab Results   Component Value Date/Time    SODIUM 142 03/31/2013 03:37 PM    POTASSIUM 4.2 03/31/2013 03:37 PM    CHLORIDE 102 03/31/2013 03:37 PM    CO2 26 03/31/2013 03:37 PM    GLUCOSE 86 03/31/2013 03:37 PM    BUN 16 03/31/2013 03:37 PM    CREATININE 1.12 03/31/2013 03:37 PM    BUN/CREATININE RATIO 14 03/31/2013 03:37 PM    GFR EST AA 92 03/31/2013 03:37 PM    GFR EST NON-AA 79 03/31/2013 03:37 PM    CALCIUM 9.1 03/31/2013 03:37 PM         Anxiety -  Good control with citalopram.  No complaints.  No depression.  Needs refill.      ROS    Physical Exam   Constitutional: He appears well-developed and well-nourished. No distress.   BP 130/95 mmHg   Pulse 75   Temp(Src) 98.6 ??F (37 ??C) (Oral)   Resp 18   Ht 6' 1.8" (1.875 m)   Wt 291 lb 2 oz (132.053 kg)   BMI 37.56 kg/m2   SpO2 96%Body mass index is 37.56 kg/(m^2).   HENT:   Mouth/Throat: Oropharynx is clear and moist.   Neck: No JVD present. Carotid bruit is not present.   Cardiovascular: Normal rate, regular rhythm, normal heart sounds and intact distal pulses.    Pulmonary/Chest: Effort normal and breath sounds normal.   Musculoskeletal: He exhibits no edema.   Neurological: He is alert.   Skin: Skin is warm and dry. He is not diaphoretic.   Nursing note and vitals reviewed.      ASSESSMENT and PLAN   [name] was seen today for medication evaluation and hypertension.    Diagnoses and all orders for this visit:    Hypertension, uncontrolled - resume medications.   Log blood pressures at home 3-5 times weekly and bring to next visit.  Call office as soon as possible if BP's over 140/90 on multiple occasions or with symptoms of dizziness, chest pain, shortness of breath, headache or ankle swelling.  Orders:  -     lisinopril-hydrochlorothiazide (PRINZIDE, ZESTORETIC) 20-12.5 mg per tablet; Take 1 Tab by mouth daily.  -     amLODIPine (NORVASC) 10 mg tablet; Take 1 Tab by mouth daily.  -     METABOLIC PANEL, BASIC  -     LIPID PANEL    Anxiety - Well controlled and stable.  his medications were reviewed and refilled where necessary as noted below.  Labs ordered as noted.    Orders:  -     citalopram (CELEXA) 20 mg tablet; Take 1 Tab by mouth daily.      Follow-up Disposition:  Return in about 3 months (around 04/23/2014).

## 2014-01-23 LAB — LIPID PANEL
Cholesterol, total: 165 mg/dL (ref 100–199)
HDL Cholesterol: 45 mg/dL (ref >39)
LDL, calculated: 96 mg/dL (ref 0–99)
Triglyceride: 118 mg/dL (ref 0–149)
VLDL, calculated: 24 mg/dL (ref 5–40)

## 2014-01-23 LAB — METABOLIC PANEL, BASIC
BUN/Creatinine ratio: 11 (ref 9–20)
BUN: 10 mg/dL (ref 6–24)
CO2: 24 mmol/L (ref 18–29)
Calcium: 9.3 mg/dL (ref 8.7–10.2)
Chloride: 102 mmol/L (ref 97–108)
Creatinine: 0.92 mg/dL (ref 0.76–1.27)
GFR est AA: 117 mL/min/{1.73_m2} (ref >59)
GFR est non-AA: 101 mL/min/{1.73_m2} (ref >59)
Glucose: 92 mg/dL (ref 65–99)
Potassium: 4.2 mmol/L (ref 3.5–5.2)
Sodium: 141 mmol/L (ref 134–144)

## 2014-01-23 LAB — CVD REPORT

## 2014-04-15 ENCOUNTER — Ambulatory Visit
Admit: 2014-04-15 | Discharge: 2014-04-15 | Payer: PRIVATE HEALTH INSURANCE | Attending: Internal Medicine | Primary: Internal Medicine

## 2014-04-15 DIAGNOSIS — J101 Influenza due to other identified influenza virus with other respiratory manifestations: Secondary | ICD-10-CM

## 2014-04-15 LAB — AMB POC RAPID INFLUENZA TEST: QuickVue Influenza test: POSITIVE

## 2014-04-15 MED ORDER — LISINOPRIL-HYDROCHLOROTHIAZIDE 20 MG-12.5 MG TAB
ORAL_TABLET | Freq: Every day | ORAL | Status: DC
Start: 2014-04-15 — End: 2014-07-01

## 2014-04-15 MED ORDER — AMLODIPINE 10 MG TAB
10 mg | ORAL_TABLET | Freq: Every day | ORAL | Status: DC
Start: 2014-04-15 — End: 2014-07-01

## 2014-04-15 NOTE — Progress Notes (Signed)
HISTORY OF PRESENT ILLNESS  Daryl Morgan is a 44 y.o. male.  HPI  Upper respiratory illness:  Daryl Morgan presents with complaints of sore throat, post nasal drip, cough described as productive of green sputum, myalgias, headache, fever and chills for 4 days.  nausea and no vomiting -prior diarrhea on sat .  he has not had  congestion.  Symptoms are moderate.  Over-the-counter remedies including advil,    has been used with poor relief of symptoms.  Drinking plenty of fluids: yes  Asthma?:  no  non-smoker  Contacts with similar infections: no     Patient Active Problem List   Diagnosis Code   ??? HTN (hypertension) I10   ??? Anxiety F41.9   ??? Obese E66.9     Past Medical History   Diagnosis Date   ??? Hypertension      No Known Allergies  Current Outpatient Prescriptions on File Prior to Visit   Medication Sig Dispense Refill   ??? lisinopril-hydrochlorothiazide (PRINZIDE, ZESTORETIC) 20-12.5 mg per tablet Take 1 Tab by mouth daily. 90 Tab 0   ??? citalopram (CELEXA) 20 mg tablet Take 1 Tab by mouth daily. 90 Tab 3   ??? amLODIPine (NORVASC) 10 mg tablet Take 1 Tab by mouth daily. 90 Tab 0     No current facility-administered medications on file prior to visit.     History   Substance Use Topics   ??? Smoking status: Former Smoker -- 0.50 packs/day for 30 years     Types: Cigarettes, Cigars     Quit date: 06/22/2013   ??? Smokeless tobacco: Never Used      Comment: 1/pp week   ??? Alcohol Use: 5.0 oz/week     10 Shots of liquor per week                ROS    Physical Exam   Constitutional: He appears well-developed and well-nourished. No distress.   BP 113/70 mmHg   Pulse 102   Temp(Src) 99.3 ??F (37.4 ??C) (Oral)   Resp 18   Ht 6' 1.8" (1.875 m)   Wt 290 lb (131.543 kg)   BMI 37.42 kg/m2   SpO2 98%   HENT:   Head: Normocephalic and atraumatic.   Right Ear: Tympanic membrane and ear canal normal. No decreased hearing is noted.   Left Ear: Tympanic membrane and ear canal normal. No decreased hearing is noted.    Nose: Mucosal edema present. No rhinorrhea. No epistaxis. Right sinus exhibits no maxillary sinus tenderness and no frontal sinus tenderness. Left sinus exhibits no maxillary sinus tenderness and no frontal sinus tenderness.   Mouth/Throat: Uvula is midline and mucous membranes are normal. No oral lesions. Normal dentition. Posterior oropharyngeal erythema present. No oropharyngeal exudate, posterior oropharyngeal edema or tonsillar abscesses.   Eyes: Conjunctivae are normal. Pupils are equal, round, and reactive to light. Right eye exhibits no discharge. Left eye exhibits no discharge. No scleral icterus.   Neck: Neck supple.   Cardiovascular: Normal rate, regular rhythm and normal heart sounds.    Pulmonary/Chest: Effort normal and breath sounds normal. No accessory muscle usage. No respiratory distress. He has no decreased breath sounds. He has no wheezes. He has no rhonchi. He has no rales.   Lymphadenopathy:     He has no cervical adenopathy.   Neurological: He is alert.   Skin: Skin is warm and dry. He is not diaphoretic.   Nursing note and vitals reviewed.      ASSESSMENT  and PLAN  Daryl Morgan was seen today for cough and chills.    Diagnoses and all orders for this visit:    Influenza B  Daryl Morgan was advised to drink plenty of liquids, get plenty of rest and try advil 200 to 600 mg with food 3 times daily as needed for bodyaches, fever or malaise.  he should use mucinex if coughing or having sinus drainage.  Return to work or school when no fever for 24 hours while off of fever reducing medication.  he should call the office or come to the emergency room for progressive symptoms of fatigue or lethargy, shortness of breath, chest pains, inability to hold down fluids, stiff neck or other new symptoms.  Daryl Morgan was given written educational materials on influenza at the end of the visit.        Flu-like symptoms  Orders:  -     AMB POC RAPID INFLUENZA TEST    Hypertension, uncontrolled  Orders:   -     lisinopril-hydrochlorothiazide (PRINZIDE, ZESTORETIC) 20-12.5 mg per tablet; Take 1 Tab by mouth daily.  -     amLODIPine (NORVASC) 10 mg tablet; Take 1 Tab by mouth daily.    Anxiety      Follow-up Disposition:  Return in about 3 months (around 07/16/2014), or if symptoms worsen or fail to improve.

## 2014-04-15 NOTE — Patient Instructions (Signed)
Influenza (Flu): Care Instructions  Your Care Instructions  Influenza (flu) is an infection in the lungs and breathing passages. It is caused by the influenza virus. There are different strains, or types, of the flu virus from year to year. Unlike the common cold, the flu comes on suddenly and the symptoms, such as a cough, congestion, fever, chills, fatigue, aches, and pains, are more severe. These symptoms may last up to 10 days. Although the flu can make you feel very sick, it usually doesn't cause serious health problems.  Home treatment is usually all you need for flu symptoms. But your doctor may prescribe antiviral medicine to prevent other health problems, such as pneumonia, from developing. Older people and those who have a long-term health condition, such as lung disease, are most at risk for having pneumonia or other health problems.  Follow-up care is a key part of your treatment and safety. Be sure to make and go to all appointments, and call your doctor if you are having problems. It???s also a good idea to know your test results and keep a list of the medicines you take.  How can you care for yourself at home?  ?? Get plenty of rest.  ?? Drink plenty of fluids, enough so that your urine is light yellow or clear like water. If you have kidney, heart, or liver disease and have to limit fluids, talk with your doctor before you increase the amount of fluids you drink.  ?? Take an over-the-counter pain medicine if needed, such as acetaminophen (Tylenol), ibuprofen (Advil, Motrin), or naproxen (Aleve), to relieve fever, headache, and muscle aches. Read and follow all instructions on the label. No one younger than 20 should take aspirin. It has been linked to Reye syndrome, a serious illness.  ?? Do not smoke. Smoking can make the flu worse. If you need help quitting, talk to your doctor about stop-smoking programs and medicines. These can increase your chances of quitting for good.   ?? Breathe moist air from a hot shower or from a sink filled with hot water to help clear a stuffy nose.  ?? Before you use cough and cold medicines, check the label. These medicines may not be safe for young children or for people with certain health problems.  ?? If the skin around your nose and lips becomes sore, put some petroleum jelly on the area.  ?? To ease coughing:  ?? Drink fluids to soothe a scratchy throat.  ?? Suck on cough drops or plain hard candy.  ?? Take an over-the-counter cough medicine that contains dextromethorphan to help you get some sleep. Read and follow all instructions on the label.  ?? Raise your head at night with an extra pillow. This may help you rest if coughing keeps you awake.  ?? Take any prescribed medicine exactly as directed. Call your doctor if you think you are having a problem with your medicine.  To avoid spreading the flu  ?? Wash your hands regularly, and keep your hands away from your face.  ?? Stay home from school, work, and other public places until you are feeling better and your fever has been gone for at least 24 hours. The fever needs to have gone away on its own without the help of medicine.  ?? Ask people living with you to talk to their doctors about preventing the flu. They may get antiviral medicine to keep from getting the flu from you.  ?? To prevent the flu in the   future, get a flu vaccine every fall. Encourage people living with you to get the vaccine.  ?? Cover your mouth when you cough or sneeze.  When should you call for help?  Call 911 anytime you think you may need emergency care. For example, call if:  ?? You have severe trouble breathing.  Call your doctor now or seek immediate medical care if:  ?? You have new or worse trouble breathing.  ?? You seem to be getting much sicker.  ?? You feel very sleepy or confused.  ?? You have a new or higher fever.  ?? You get a new rash.  Watch closely for changes in your health, and be sure to contact your doctor if:   ?? You begin to get better and then get worse.  ?? You are not getting better after 1 week.   Where can you learn more?   Go to http://www.healthwise.net/[facility]  Enter L652 in the search box to learn more about "Influenza (Flu): Care Instructions."   ?? 2006-2015 [employer]. Care instructions adapted under license by [facility] (which disclaims liability or warranty for this information). This care instruction is for use with your licensed healthcare professional. If you have questions about a medical condition or this instruction, always ask your healthcare professional. Healthwise, Incorporated disclaims any warranty or liability for your use of this information.  Content Version: 10.7.482551; Current as of: September 12, 2013

## 2014-04-23 ENCOUNTER — Encounter: Attending: Internal Medicine | Primary: Internal Medicine

## 2014-04-27 ENCOUNTER — Encounter: Attending: Internal Medicine | Primary: Internal Medicine

## 2014-05-05 ENCOUNTER — Ambulatory Visit
Admit: 2014-05-05 | Discharge: 2014-05-05 | Payer: PRIVATE HEALTH INSURANCE | Attending: Internal Medicine | Primary: Internal Medicine

## 2014-05-05 DIAGNOSIS — R059 Cough, unspecified: Secondary | ICD-10-CM

## 2014-05-05 MED ORDER — BENZONATATE 200 MG CAP
200 mg | ORAL_CAPSULE | Freq: Three times a day (TID) | ORAL | Status: AC | PRN
Start: 2014-05-05 — End: 2014-05-12

## 2014-05-05 MED ORDER — ALBUTEROL SULFATE HFA 90 MCG/ACTUATION AEROSOL INHALER
90 mcg/actuation | RESPIRATORY_TRACT | Status: DC | PRN
Start: 2014-05-05 — End: 2014-07-01

## 2014-05-05 NOTE — Progress Notes (Signed)
HISTORY OF PRESENT ILLNESS  Daryl Morgan is a 44 y.o. male.  HPI  .Upper respiratory illness:  Daryl Morgan presents with complaints of sneezing and cough described as productive of yellow sputum for 3 weeks.  no nausea and no vomiting .  he has not had  congestion, sore throat, night sweats, myalgias, fever and chills.  Symptoms are mild.  Over-the-counter remedies including mucinex DM bid   has been used with poor relief of symptoms.  Drinking plenty of fluids: yes  Asthma?:  no  non-smoker  Contacts with similar infections: no           ROS    Physical Exam   Constitutional: He appears well-developed and well-nourished. No distress.   BP 127/79 mmHg   Pulse 79   Temp(Src) 98.6 ??F (37 ??C) (Oral)   Resp 18   Ht 6' 1.8" (1.875 m)   Wt 295 lb (133.811 kg)   BMI 38.06 kg/m2   SpO2 97%   HENT:   Head: Normocephalic and atraumatic.   Right Ear: No decreased hearing is noted.   Left Ear: No decreased hearing is noted.   Nose: No mucosal edema or rhinorrhea. No epistaxis.   Mouth/Throat: Uvula is midline and mucous membranes are normal. No oral lesions. Normal dentition. No oropharyngeal exudate, posterior oropharyngeal edema, posterior oropharyngeal erythema or tonsillar abscesses.   Eyes: Conjunctivae are normal. Pupils are equal, round, and reactive to light. Right eye exhibits no discharge. Left eye exhibits no discharge. No scleral icterus.   Neck: Neck supple.   Cardiovascular: Normal rate, regular rhythm and normal heart sounds.    Pulmonary/Chest: Effort normal. No accessory muscle usage. No respiratory distress. He has no decreased breath sounds. He has wheezes (coarse diffuse). He has no rhonchi. He has no rales.   Lymphadenopathy:     He has no cervical adenopathy.   Neurological: He is alert.   Skin: Skin is warm and dry. He is not diaphoretic.   Nursing note and vitals reviewed.      ASSESSMENT and PLAN  Daryl Morgan was seen today for cough.    Diagnoses and all orders for this visit:     Cough -likely residual post URI bronchitic inflammation.  ? Allergic?  No alarm symptoms for community-acquired pneumonia.  Suppress cough, bronchodilate.  Call if not continuing to improve  Orders:  -     benzonatate (TESSALON) 200 mg capsule; Take 1 Cap by mouth three (3) times daily as needed for Cough for up to 7 days.  -     albuterol (PROVENTIL HFA, VENTOLIN HFA, PROAIR HFA) 90 mcg/actuation inhaler; Take 2 Puffs by inhalation every four (4) hours as needed for Wheezing.      Follow-up Disposition:  Return if symptoms worsen or fail to improve.

## 2014-05-21 NOTE — Telephone Encounter (Signed)
Patient would like for the nurse to call him back regarding a Z pack his no is [phone]

## 2014-05-21 NOTE — Telephone Encounter (Signed)
The patient is schedule for tomorrow morning to see Dr. Aundria Rudogers for an evaluation.

## 2014-05-21 NOTE — Telephone Encounter (Signed)
Patient came in March 23 for the flu. He came back in April 12 for a cough. The medication he received for the cough didn't make him feel any better. He is reporting cough, sour throat, congestion, and drainage. His cough is keeping him up all night.

## 2014-05-21 NOTE — Telephone Encounter (Signed)
He needs follow up appt for reeval.

## 2014-05-21 NOTE — Telephone Encounter (Signed)
Patient was in on April 12,2016 for bron and cough and has not cleared up would like for Dr Daryl Morgan to call in Z pack I told he needed be seen. He wants to speak with Dr Daryl Morgan his nois [phone]

## 2014-05-22 ENCOUNTER — Ambulatory Visit
Admit: 2014-05-22 | Discharge: 2014-05-22 | Payer: PRIVATE HEALTH INSURANCE | Attending: Internal Medicine | Primary: Internal Medicine

## 2014-05-22 ENCOUNTER — Telehealth

## 2014-05-22 ENCOUNTER — Inpatient Hospital Stay: Admit: 2014-05-22 | Payer: PRIVATE HEALTH INSURANCE | Attending: Internal Medicine | Primary: Internal Medicine

## 2014-05-22 DIAGNOSIS — R05 Cough: Secondary | ICD-10-CM

## 2014-05-22 DIAGNOSIS — R058 Other specified cough: Secondary | ICD-10-CM

## 2014-05-22 MED ORDER — HYDROCODONE 10 MG-CHLORPHENIRAMINE 8 MG/5 ML ORAL SUSP EXTEND.REL 12HR
10-8 mg/5 mL | Freq: Two times a day (BID) | ORAL | Status: DC | PRN
Start: 2014-05-22 — End: 2014-05-22

## 2014-05-22 MED ORDER — PREDNISONE 10 MG TABLETS IN A DOSE PACK
10 mg | ORAL_TABLET | ORAL | Status: DC
Start: 2014-05-22 — End: 2014-07-01

## 2014-05-22 MED ORDER — HYDROCODONE 10 MG-CHLORPHENIRAMINE 8 MG/5 ML ORAL SUSP EXTEND.REL 12HR
10-8 mg/5 mL | Freq: Two times a day (BID) | ORAL | Status: DC | PRN
Start: 2014-05-22 — End: 2014-07-01

## 2014-05-22 MED ORDER — BUDESONIDE-FORMOTEROL HFA 80 MCG-4.5 MCG/ACTUATION AEROSOL INHALER
Freq: Two times a day (BID) | RESPIRATORY_TRACT | Status: DC
Start: 2014-05-22 — End: 2014-07-01

## 2014-05-22 NOTE — Telephone Encounter (Signed)
Pt called stating he would like to know if there is an alternative to Symbicort because it is too expensive. [phone]

## 2014-05-22 NOTE — Telephone Encounter (Signed)
Pharmacist called stating that chlorpheniramine-HYDROcodone can not be called in. The patient needs a hard copy. LM informing patient that he has to come back and pick up the prescription from our office.

## 2014-05-22 NOTE — Telephone Encounter (Signed)
Provide coupon for symbicort for copay reduction.

## 2014-05-22 NOTE — Progress Notes (Signed)
HISTORY OF PRESENT ILLNESS  Daryl Morgan is a 44 y.o. male.  HPI  Problem follow up:  Daryl Morgan returns for follow up visit regarding ongoing productive cough.  he was seen 2 weeks ago in offcie diagnosed with post bronchitic cough and treated with albuterol, tessalon.  Workup was significant for same.  Notes, labs, studies, imaging related to this problem during prior visit were available .  Since that visit, he has not changed.  he has been compliant with prescribed treatment.   Residual symptoms include: cough productive of yellow sputum.  No PND, The patient denies fevers, chills or sweats. But does feel fatigued/ malaise in pm.  No shortness of breath.  Still exercising.  New issues associated with this problem include: none.         History   Smoking status   ??? Former Smoker -- 0.50 packs/day for 30 years   ??? Types: Cigarettes, Cigars   ??? Quit date: 06/22/2013   Smokeless tobacco   ??? Never Used     Comment: 1/pp week       Review of Systems   Constitutional: Negative for fever, chills, malaise/fatigue and diaphoresis.   HENT: Negative for congestion and sore throat.    Respiratory: Positive for cough, sputum production and wheezing. Negative for hemoptysis and shortness of breath.    Cardiovascular: Negative for chest pain, palpitations and leg swelling.   Gastrointestinal: Negative.    Endo/Heme/Allergies: Negative for environmental allergies.       Physical Exam   Constitutional: He appears well-developed and well-nourished. No distress.   BP 129/82 mmHg   Pulse 89   Temp(Src) 98.4 ??F (36.9 ??C) (Oral)   Resp 18   Ht 6' 1.8" (1.875 m)   Wt 294 lb 4 oz (133.471 kg)   BMI 37.97 kg/m2   SpO2 98%Body mass index is 37.97 kg/(m^2).   HENT:   Mouth/Throat: Oropharynx is clear and moist.   Neck: No JVD present. Carotid bruit is not present.   Cardiovascular: Normal rate, regular rhythm, normal heart sounds and intact distal pulses.    Pulmonary/Chest: Effort normal. No accessory muscle usage. No respiratory  distress. He has no decreased breath sounds. He has wheezes in the right upper field, the right middle field, the right lower field, the left upper field, the left middle field and the left lower field. He has no rhonchi. He has no rales.   Musculoskeletal: He exhibits no edema.   Neurological: He is alert.   Skin: Skin is warm and dry. He is not diaphoretic.   Nursing note and vitals reviewed.    chest X-ray - Examination:?? CR CHEST PA AND LATERAL?? - [id number] - May 22 2014 12:37PM  Accession No:?? [id number]  Reason:?? productive cough/  ??  ??  REPORT:  Indication: Productive cough.  ??  Exam: PA and lateral views of the chest.  ??  There is no prior study for direct comparison.  ??  Findings: Cardiomediastinal silhouette is within normal limits. Lungs are ??  clear bilaterally. Pleural spaces are normal. Osseous structures are intact.  ?? ??  ??  IMPRESSION: No acute cardiopulmonary disease.  ASSESSMENT and PLAN  Daryl Morgan was seen today for cough.    Diagnoses and all orders for this visit:    Productive cough - xray clear.  Reviewed with pt.  Add tussionex, symbicort, steroid taper.  Call me in 1 week with status.  Orders:  -     XR CHEST PA LAT; Future  -  predniSONE (STERAPRED DS) 10 mg dose pack; See administration instruction per  dose pack  -     chlorpheniramine-HYDROcodone (TUSSIONEX PENNKINETIC ER) 10-8 mg/5 mL suspension; Take 5 mL by mouth every twelve (12) hours as needed for Cough. Max Daily Amount: 10 mL.    Reactive airway disease with wheezing, mild persistent, uncomplicated  Orders:  -     predniSONE (STERAPRED DS) 10 mg dose pack; See administration instruction per  dose pack  -     budesonide-formoterol (SYMBICORT) 80-4.5 mcg/actuation HFAA inhaler; Take 2 Puffs by inhalation two (2) times a day.      Follow-up Disposition:  Return if symptoms worsen or fail to improve.

## 2014-05-22 NOTE — Telephone Encounter (Signed)
New Medication or refill(s) ordered, printed and signed. Ready for pick up.  Please notify patient if necessary.

## 2014-05-25 NOTE — Telephone Encounter (Signed)
Given to patient.

## 2014-05-28 NOTE — Telephone Encounter (Signed)
-----   Message from Diona BrownerJanae L Gillyard sent at 05/28/2014 12:53 PM EDT -----  Regarding: Dr.Rogers/Telephone  Pt called to f/up with doctor. The pt would like a call back from Dr.Rogers at [phone].

## 2014-05-28 NOTE — Telephone Encounter (Signed)
Ok good.

## 2014-05-28 NOTE — Telephone Encounter (Signed)
Patient called to give Dr. Aundria Rudogers a report on how he was feeling. He stated he is feeling much better. His cough is slight but still there. Was very thankful.

## 2014-07-01 ENCOUNTER — Ambulatory Visit
Admit: 2014-07-01 | Discharge: 2014-07-01 | Payer: PRIVATE HEALTH INSURANCE | Attending: Internal Medicine | Primary: Internal Medicine

## 2014-07-01 DIAGNOSIS — I10 Essential (primary) hypertension: Secondary | ICD-10-CM

## 2014-07-01 MED ORDER — AMLODIPINE 10 MG TAB
10 mg | ORAL_TABLET | Freq: Every day | ORAL | Status: DC
Start: 2014-07-01 — End: 2015-01-11

## 2014-07-01 MED ORDER — LISINOPRIL-HYDROCHLOROTHIAZIDE 20 MG-12.5 MG TAB
ORAL_TABLET | Freq: Every day | ORAL | Status: DC
Start: 2014-07-01 — End: 2015-01-11

## 2014-07-01 MED ORDER — AMLODIPINE 10 MG TAB
10 mg | ORAL_TABLET | Freq: Every day | ORAL | Status: DC
Start: 2014-07-01 — End: 2014-07-01

## 2014-07-01 MED ORDER — LISINOPRIL-HYDROCHLOROTHIAZIDE 20 MG-12.5 MG TAB
ORAL_TABLET | Freq: Every day | ORAL | Status: DC
Start: 2014-07-01 — End: 2014-07-01

## 2014-07-01 NOTE — Progress Notes (Signed)
HISTORY OF PRESENT ILLNESS  Daryl Morgan is a 44 y.o. male.  HPI  Hypertension:  Daryl Morgan is a 44 y.o. male with hypertension.    without Chronic kidney disease stage    Medication change since last visit: No  The patient reports:  taking medications as instructed, no medication side effects noted, patient does not perform home BP monitoring, no chest pain on exertion, no dyspnea on exertion, no swelling of ankles, no orthostatic dizziness or lightheadedness, no palpitations.       Lifestyle modification/social history: generally follows a low fat low cholesterol diet, generally follows a low sodium diet    Lab Results   Component Value Date/Time    SODIUM 141 01/22/2014 10:39 AM    POTASSIUM 4.2 01/22/2014 10:39 AM    CHLORIDE 102 01/22/2014 10:39 AM    CO2 24 01/22/2014 10:39 AM    GLUCOSE 92 01/22/2014 10:39 AM    BUN 10 01/22/2014 10:39 AM    CREATININE 0.92 01/22/2014 10:39 AM    BUN/CREATININE RATIO 11 01/22/2014 10:39 AM    GFR EST AA 117 01/22/2014 10:39 AM    GFR EST NON-AA 101 01/22/2014 10:39 AM    CALCIUM 9.3 01/22/2014 10:39 AM       Hx of anxiety- rare panic attack months ago.  Otherwise doing very well.    Chronic cough.  Much better now.  He reports distant aspiration of corn kernal and recently coughed up corn husk.  Immediately cough resolved.       ROS    Physical Exam  BP 112/80 mmHg   Pulse 80   Temp(Src) 98.7 ??F (37.1 ??C) (Oral)   Resp 18   Ht 6' 1.8" (1.875 m)   Wt 291 lb 8 oz (132.224 kg)   BMI 37.61 kg/m2   SpO2 98%    ASSESSMENT and PLAN  Daryl Morgan was seen today for hypertension.    Diagnoses and all orders for this visit:    Essential hypertension with goal blood pressure less than 130/80 - Well controlled and stable.  his medications were reviewed and refilled where necessary as noted below.  Labs ordered as noted.   Log blood pressures at home while sitting, relaxed 3-5 times monthly and bring to next visit.  Pt educated on goal BP of 130/80 on average or  lower.  Call office as soon as possible if BP's over 140/90 or below 110/50 on multiple occasions and/or with symptoms of dizziness, chest pain, shortness of breath, headache or ankle swelling.    Orders:  -    -     amLODIPine (NORVASC) 10 mg tablet; Take 1 Tab by mouth daily.  -     lisinopril-hydrochlorothiazide (PRINZIDE, ZESTORETIC) 20-12.5 mg per tablet; Take 1 Tab by mouth daily.    Anxiety - Well controlled and stable.  his medications were reviewed and refilled where necessary as noted below.  Labs ordered as noted.        Follow-up Disposition: Not on File

## 2015-01-01 ENCOUNTER — Encounter: Attending: Internal Medicine | Primary: Internal Medicine

## 2015-01-11 ENCOUNTER — Encounter

## 2015-01-11 MED ORDER — CITALOPRAM 20 MG TAB
20 mg | ORAL_TABLET | ORAL | 0 refills | Status: DC
Start: 2015-01-11 — End: 2015-05-11

## 2015-01-11 MED ORDER — AMLODIPINE 10 MG TAB
10 mg | ORAL_TABLET | ORAL | 0 refills | Status: DC
Start: 2015-01-11 — End: 2015-05-11

## 2015-01-11 MED ORDER — LISINOPRIL-HYDROCHLOROTHIAZIDE 20 MG-12.5 MG TAB
ORAL_TABLET | ORAL | 0 refills | Status: DC
Start: 2015-01-11 — End: 2015-05-11

## 2015-01-11 NOTE — Telephone Encounter (Signed)
Overdue for followup appointment.  Call to schedule.

## 2015-01-12 NOTE — Telephone Encounter (Signed)
Kelly left message for patient to return call and schedule a med check appt with PCP.

## 2015-01-20 ENCOUNTER — Ambulatory Visit
Admit: 2015-01-20 | Discharge: 2015-01-20 | Payer: PRIVATE HEALTH INSURANCE | Attending: Internal Medicine | Primary: Internal Medicine

## 2015-01-20 DIAGNOSIS — I10 Essential (primary) hypertension: Secondary | ICD-10-CM

## 2015-01-20 NOTE — Progress Notes (Signed)
HISTORY OF PRESENT ILLNESS  Daryl Morgan is a 44 y.o. male.  HPI  Hypertension:  Daryl Morgan is a 44 y.o. male with hypertension.    without Chronic kidney disease stage    Medication change since last visit: No  The patient reports:  taking medications as instructed, no medication side effects noted, home BP monitoring in range of 110's systolic over 80's diastolic, no TIA's, no chest pain on exertion, no dyspnea on exertion, no swelling of ankles, no orthostatic dizziness or lightheadedness, no palpitations.       Lifestyle modification/social history: not attempting to follow a low fat, low cholesterol diet, generally follows a low sodium diet, exercises sporadically, nonsmoker    Lab Results   Component Value Date/Time    Sodium 141 01/22/2014 10:39 AM    Potassium 4.2 01/22/2014 10:39 AM    Chloride 102 01/22/2014 10:39 AM    CO2 24 01/22/2014 10:39 AM    Glucose 92 01/22/2014 10:39 AM    BUN 10 01/22/2014 10:39 AM    Creatinine 0.92 01/22/2014 10:39 AM    BUN/Creatinine ratio 11 01/22/2014 10:39 AM    GFR est AA 117 01/22/2014 10:39 AM    GFR est non-AA 101 01/22/2014 10:39 AM    Calcium 9.3 01/22/2014 10:39 AM     Anxiety - he reports good control with citalopram.  No side effects.         STOPBANG survey given to pt to complete.    Results as follows:  Snoring: YES  Tired: YES  Observed: NO  Pressure: YES  BMI >35: YES  Age >50: NO  Neck (>17, >16): YES  Gender Male: YES    Score:  6 / 8   (High risk if > 3)    He also reports his wife has noticed he kicks his legs throughout the night.  Pt is unaware.    Patient Active Problem List   Diagnosis Code   ??? HTN (hypertension) I10   ??? Anxiety F41.9   ??? Obese E66.9     Past Medical History   Diagnosis Date   ??? Hypertension      No Known Allergies  Current Outpatient Prescriptions on File Prior to Visit   Medication Sig Dispense Refill   ??? lisinopril-hydroCHLOROthiazide (PRINZIDE, ZESTORETIC) 20-12.5 mg per tablet Take 1 tablet by mouth  daily 90 Tab 0    ??? amLODIPine (NORVASC) 10 mg tablet Take 1 tablet by mouth  daily 90 Tab 0   ??? citalopram (CELEXA) 20 mg tablet TAKE 1 TABLET BY MOUTH  DAILY 90 Tab 0     No current facility-administered medications on file prior to visit.      Social History   Substance Use Topics   ??? Smoking status: Former Smoker     Packs/day: 0.50     Years: 30.00     Types: Cigarettes, Cigars     Quit date: 06/22/2013   ??? Smokeless tobacco: Never Used      Comment: 1/pp week   ??? Alcohol use 5.0 oz/week     10 Shots of liquor per week         ROS    Physical Exam   Constitutional: He appears well-developed and well-nourished. No distress.   BP 116/83 (BP 1 Location: Left arm, BP Patient Position: Sitting)  Pulse 92  Temp 98.6 ??F (37 ??C) (Oral)   Resp 18  Ht 6' 1.8" (1.875 m)  Wt 306 lb 2 oz (138.9 kg)  SpO2 96%  BMI 39.52 kg/m2Body mass index is 39.52 kg/(m^2).   HENT:   Mouth/Throat: Oropharynx is clear and moist.   Neck: No JVD present. Carotid bruit is not present.   Cardiovascular: Normal rate, regular rhythm, normal heart sounds and intact distal pulses.    Pulmonary/Chest: Effort normal and breath sounds normal.   Musculoskeletal: He exhibits no edema.   Neurological: He is alert.   Skin: Skin is warm and dry. He is not diaphoretic.   Nursing note and vitals reviewed.      ASSESSMENT and PLAN  Daryl Morgan was seen today for hypertension.    Diagnoses and all orders for this visit:    Essential hypertension - Well controlled and stable.  his medications were reviewed and refilled where necessary as noted below.  Labs ordered as noted.  The patient is advised to begin progressive daily aerobic exercise program and attempt to lose weight.    -     LIPID PANEL  -     METABOLIC PANEL, BASIC    Anxiety - Well controlled and stable.  his medications were reviewed and refilled where necessary as noted below.  Labs ordered as noted.      Suspected sleep apnea  -     REFERRAL TO SLEEP STUDIES    He may also have PLMD - would be identified with sleep study   Follow-up Disposition:  Return in about 6 months (around 07/21/2015) for physical.

## 2015-01-21 LAB — METABOLIC PANEL, BASIC
BUN/Creatinine ratio: 11 (ref 9–20)
BUN: 10 mg/dL (ref 6–24)
CO2: 26 mmol/L (ref 18–29)
Calcium: 9 mg/dL (ref 8.7–10.2)
Chloride: 100 mmol/L (ref 96–106)
Creatinine: 0.94 mg/dL (ref 0.76–1.27)
GFR est AA: 113 mL/min/{1.73_m2} (ref >59)
GFR est non-AA: 98 mL/min/{1.73_m2} (ref >59)
Glucose: 89 mg/dL (ref 65–99)
Potassium: 4 mmol/L (ref 3.5–5.2)
Sodium: 143 mmol/L (ref 134–144)

## 2015-01-21 LAB — LIPID PANEL
Cholesterol, total: 165 mg/dL (ref 100–199)
HDL Cholesterol: 44 mg/dL (ref >39)
LDL, calculated: 96 mg/dL (ref 0–99)
Triglyceride: 124 mg/dL (ref 0–149)
VLDL, calculated: 25 mg/dL (ref 5–40)

## 2015-01-21 LAB — CVD REPORT

## 2015-01-27 NOTE — Progress Notes (Signed)
LVM to offer appt opening for 01/28/2015 @11 :00am

## 2015-02-05 ENCOUNTER — Ambulatory Visit
Admit: 2015-02-05 | Discharge: 2015-02-05 | Payer: PRIVATE HEALTH INSURANCE | Attending: Internal Medicine | Primary: Internal Medicine

## 2015-02-05 DIAGNOSIS — G4733 Obstructive sleep apnea (adult) (pediatric): Secondary | ICD-10-CM

## 2015-02-05 NOTE — Progress Notes (Signed)
[address] [place], [place] [address]  Tel.  [phone]  Fax. [phone] [address]  [place], [place] [address]  Tel.  [phone]  Fax. [phone] [address]  [place], [place] [address]  Tel.  [phone]  Fax. [phone]         Subjective:      Daryl Morgan is an 45 y.o. male referred for evaluation for a sleep disorder. He complains of excessive daytime sleepiness associated with awakening in the middle of the night because of SOB.  Symptoms began several years ago, gradually worsening since that time. He usually can fall asleep in 5 minutes.  Family or house members note snoring. He denies completely or partially paralyzed while falling asleep or waking up.  Daryl Morgan does wake up frequently at night. He is not bothered by waking up too early and left unable to get back to sleep. He actually sleeps about 9 hours at night and wakes up about 5 (4-6) times during the night. He does work shifts:  Technical sales engineer.   [place] indicates he does not get too little sleep at night. His bedtime is 2200. He awakens at 0730. He does not take naps. He takes   naps a week lasting  . He has the following observed behaviors: Loud snoring, Pauses in breathing, Biting tongue, Kicking with legs;  .  Other remarks: vivid drams, waking with gasp    Epworth Sleepiness Score: 12   which reflect moderate daytime drowsiness.    No Known Allergies      Current Outpatient Prescriptions:   ???  lisinopril-hydroCHLOROthiazide (PRINZIDE, ZESTORETIC) 20-12.5 mg per tablet, Take 1 tablet by mouth  daily, Disp: 90 Tab, Rfl: 0  ???  amLODIPine (NORVASC) 10 mg tablet, Take 1 tablet by mouth  daily, Disp: 90 Tab, Rfl: 0  ???  citalopram (CELEXA) 20 mg tablet, TAKE 1 TABLET BY MOUTH  DAILY, Disp: 90 Tab, Rfl: 0     He  has a past medical history of Hypertension and Psychiatric disorder.    He  has a past surgical history that includes uvulopalatopharyngoplasty; uvulopalatopharyngoplasty; and heent.     He family history includes Cancer in his maternal grandmother. There is no history of Diabetes or Hypertension.    He  reports that he quit smoking about 19 months ago. His smoking use included Cigarettes and Cigars. He has a 15.00 pack-year smoking history. He has never used smokeless tobacco. He reports that he drinks about 5.0 oz of alcohol per week  He reports that he uses illicit drugs, including Hallucinogenics, Marijuana, and Cocaine.     Review of Systems:  Constitutional:  No significant weight loss or weight gain.  Eyes:  No blurred vision.  CVS:  No significant chest pain  Pulm:  No significant shortness of breath  GI:  No significant nausea or vomiting  GU:  No significant nocturia  Musculoskeletal:  No significant joint pain at night  Skin:  No significant rashes  Neuro:  No significant dizziness   Psych:  No active mood issues    Sleep Review of Systems: notable for no difficulty falling asleep; frequent awakenings at night;  irregular dreaming noted; no nightmares ; no early morning headaches; no memory problems; no concentration issues; no history of any automobile or occupational accidents due to daytime drowsiness.      Objective:     Visit Vitals   ??? BP 119/83   ??? Pulse 89   ??? Ht  6' 1.8" (1.875 m)   ??? Wt 305 lb (138.3 kg)   ??? SpO2 96%   ??? BMI 39.37 kg/m2         General:   Not in acute distress   Eyes:  Anicteric sclerae, no obvious strabismus   Nose:  No obvious nasal septum deviation    Oropharynx:   Class 3 oropharyngeal outlet, thick tongue base, absent uvula, low-lying soft palate, narrow tonsilo-pharyngeal pilars   Tonsils:   tonsils are absent   Neck:   Neck circ. in "inches": 19; midline trachea   Chest/Lungs:  Equal lung expansion, clear on auscultation    CVS:  Normal rate, regular rhythm; no JVD   Skin:  Warm to touch; no obvious rashes   Neuro:  No focal deficits ; no obvious tremor    Psych:  Normal affect,  normal countenance;          Assessment:       ICD-10-CM ICD-9-CM     1. OSA (obstructive sleep apnea) G47.33 327.23    2. RLS (restless legs syndrome) G25.81 333.94    3. Obesity (BMI 30-39.9) E66.9 278.00          Plan:     * The patient currently has a Moderate Risk for having sleep apnea.  STOP-BANG score 5.  * PSG was ordered for initial evaluation.    * He was provided information on sleep apnea including coresponding risk factors and the importance of proper treatment.  * Counseling was provided regarding proper sleep hygiene and safe driving.  * We'll call him with test results.  * He is not opposed to CPAP therapy if positive for sleep apnea.  * He was provided information on Restless Leg Syndrome/Periodic Limb Movement Disorder.  * Ancillary testing for anemia and uremia should be obtained; ferritin level below 50ng/ml may be treated with iron supplementation Ferrous Sulfate 325mg  TID.  * He was provided stretching exercises at bedtime to help relieve symptoms.  * Leg massage and warm baths at bedtime as well as Vitamin E supplementation 400IU daily has also been shown to be effective.  * Avoidance of precipitating factors such as caffeine, alcohol, and nicotine was likewise advised.  * consider bupropion instead of an SSRI to improve RLS symptoms and pregression  I have reviewed/discussed the above normal BMI with the patient.  I have recommended the following interventions: dietary management education, guidance, and counseling . The plan is as follows: I have counseled this patient on diet and exercise regimens. .    Thank you for allowing us to participate in your patient's medical care.  We'll keep you updated on these investigations.    Theodoro KosGerard Tylesha Gibeault, M.D.  (electronically signed)  Diplomate in Sleep Medicine, [employer]

## 2015-02-05 NOTE — Patient Instructions (Signed)
[address]  [place] [address]  Tel.  [phone]  Fax. [phone] [address]  [place] [address]  Tel.  [phone]  Fax. [phone] [address] [place] [address]  Tel.  [phone]  Fax. [phone]     Sleep Apnea: After Your Visit  Your Care Instructions  Sleep apnea occurs when you frequently stop breathing for 10 seconds or longer during sleep. It can be mild to severe, based on the number of times per hour that you stop breathing or have slowed breathing. Blocked or narrowed airways in your nose, mouth, or throat can cause sleep apnea. Your airway can become blocked when your throat muscles and tongue relax during sleep.  Sleep apnea is common, occurring in 1 out of 20 individuals.  Individuals having any of the following characteristics should be evaluated and treated right away due to high risk and detrimental consequences from untreated sleep apnea:  1. Obesity  2. Congestive Heart failure  3. Atrial Fibrillation  4. Uncontrolled Hypertension  5. Type II Diabetes  6. Night-time Arrhythmias  7. Stroke  8. Pulmonary Hypertension  9. High-risk Driving Populations (pilots, truck drivers, etc.)  10. Patients Considering Weight-loss Surgery    How do you know you have sleep apnea?  You probably have sleep apnea if you answer 'yes' to 3 or more of the following questions:  S - Have you been told that you Snore?   T - Are you often Tired during the day?  O - Has anyone Observed you stop breathing while sleeping?  P- Do you have (or are being treated for) high blood Pressure?    B - Are you obese (Body Mass Index > 35)?  A - Is your Age 46 years old or older?  N - Is your Neck size greater than 16 inches?  G - Are you male Gender?  A sleep physician can prescribe a breathing device that prevents tissues in the throat from blocking your airway. Or your doctor may recommend using a dental device (oral breathing device) to help keep your airway  open. In some cases, surgery may be needed to remove enlarged tissues in the throat.  Follow-up care is a key part of your treatment and safety. Be sure to make and go to all appointments, and call your doctor if you are having problems. It's also a good idea to know your test results and keep a list of the medicines you take.  How can you care for yourself at home?  ?? Lose weight, if needed. It may reduce the number of times you stop breathing or have slowed breathing.  ?? Go to bed at the same time every night.  ?? Sleep on your side. It may stop mild apnea. If you tend to roll onto your back, sew a pocket in the back of your pajama top. Put a tennis ball into the pocket, and stitch the pocket shut. This will help keep you from sleeping on your back.  ?? Avoid alcohol and medicines such as sleeping pills and sedatives before bed.  ?? Do not smoke. Smoking can make sleep apnea worse. If you need help quitting, talk to your doctor about stop-smoking programs and medicines. These can increase your chances of quitting for good.  ?? Prop up the head of your bed 4 to 6 inches by putting bricks under the legs of the bed.  ?? Treat breathing problems, such as a stuffy nose, caused   by a cold or allergies.  ?? Use a continuous positive airway pressure (CPAP) breathing machine if lifestyle changes do not help your apnea and your doctor recommends it. The machine keeps your airway from closing when you sleep.  ?? If CPAP does not help you, ask your doctor whether you should try other breathing machines. A bilevel positive airway pressure machine has two types of air pressure????????one for breathing in and one for breathing out. Another device raises or lowers air pressure as needed while you breathe.  ?? If your nose feels dry or bleeds when using one of these machines, talk with your doctor about increasing moisture in the air. A humidifier may help.  ?? If your nose is runny or stuffy from using a breathing machine, talk  with your doctor about using decongestants or a corticosteroid nasal spray.  When should you call for help?  Watch closely for changes in your health, and be sure to contact your doctor if:  ?? You still have sleep apnea even though you have made lifestyle changes.  ?? You are thinking of trying a device such as CPAP.  ?? You are having problems using a CPAP or similar machine.                Where can you learn more?   Go to http://www.healthwise.net/[facility].  Enter J936 in the search box to learn more about "Sleep Apnea: After Your Visit."   ?? 2006-2010 Healthwise, Incorporated. Care instructions adapted under license by [facility] (which disclaims liability or warranty for this information). This care instruction is for use with your licensed healthcare professional. If you have questions about a medical condition or this instruction, always ask your healthcare professional. Healthwise disclaims any warranty or liability for your use of this information.      PROPER SLEEP HYGIENE    What to avoid  ?? Do not have drinks with caffeine, such as coffee or black tea, for 8 hours before bed.  ?? Do not smoke or use other types of tobacco near bedtime. Nicotine is a stimulant and can keep you awake.  ?? Avoid drinking alcohol late in the evening, because it can cause you to wake in the middle of the night.  ?? Do not eat a big meal close to bedtime. If you are hungry, eat a light snack.  ?? Do not drink a lot of water close to bedtime, because the need to urinate may wake you up during the night.  ?? Do not read or watch TV in bed. Use the bed only for sleeping and sexual activity.  What to try  ?? Go to bed at the same time every night, and wake up at the same time every morning. Do not take naps during the day.  ?? Keep your bedroom quiet, dark, and cool.  ?? Get regular exercise, but not within 3 to 4 hours of your bedtime..  ?? Sleep on a comfortable pillow and mattress.   ?? If watching the clock makes you anxious, turn it facing away from you so you cannot see the time.  ?? If you worry when you lie down, start a worry book. Well before bedtime, write down your worries, and then set the book and your concerns aside.  ?? Try meditation or other relaxation techniques before you go to bed.  ?? If you cannot fall asleep, get up and go to another room until you feel sleepy. Do something relaxing. Repeat your bedtime routine   before you go to bed again.  ?? Make your house quiet and calm about an hour before bedtime. Turn down the lights, turn off the TV, log off the computer, and turn down the volume on music. This can help you relax after a busy day.    Drowsy Driving  The [place] National Highway Traffic Safety Administration cites drowsiness as a causing factor in more than 100,000 police reported crashes annually, resulting in 76,000 injuries and 1,500 deaths. Other surveys suggest 55% of people polled have driven while drowsy in the past year, 23% had fallen asleep but not crashed, 3% crashed, and 2% had and accident due to drowsy driving.  Who is at risk?   Young Drivers: One study of drowsy driving accidents states that 55% of the drivers were under 25 years. Of those, 75% were male.   Shift Workers and Travelers: People who work overnight or travel across time zones frequently are at higher risk of experiencing Circadian Rhythm Disorders. They are trying to work and function when their body is programed to sleep.   Sleep Deprived: Lack of sleep has a serious impact on your ability to pay attention or focus on a task. Consistently getting less than the average of 8 hours your body needs creates partial or cumulative sleep deprivation.   Untreated Sleep Disorders: Sleep Apnea, Narcolepsy, R.L.S., and other sleep disorders (untreated) prevent a person from getting enough restful sleep. This leads to excessive daytime sleepiness and increases the risk  for drowsy driving accidents by up to 7 times.  Medications / Alcohol: Even over the counter medications can cause drowsiness. Medications that impair a drivers attention should have a warning label. Alcohol naturally makes you sleepy and on its own can cause accidents. Combined with excessive drowsiness its effects are amplified.   Signs of Drowsy Driving:   * You don't remember driving the last few miles   * You may drift out of your lane   * You are unable to focus and your thoughts wander   * You may yawn more often than normal   * You have difficulty keeping your eyes open / nodding off   * Missing traffic signs, speeding, or tailgating  Prevention-   Good sleep hygiene, lifestyle and behavioral choices have the most impact on drowsy driving. There is no substitute for sleep and the average person requires 8 hours nightly. If you find yourself driving drowsy, stop and sleep. Consider the sleep hygiene tips provided during your visit as well.     Medication Refill Policy: Refills for all medications require 1 week advance notice. Please have your pharmacy fax a refill request. We are unable to fax, or call in "controled substance" medications and you will need to pick these prescriptions up from our office.     MyChart Activation    Thank you for requesting access to MyChart. Please follow the instructions below to securely access and download your online medical record. MyChart allows you to send messages to your doctor, view your test results, renew your prescriptions, schedule appointments, and more.    How Do I Sign Up?    1. In your internet browser, go to [email or web address].  2. Click on the First Time User? Click Here link in the Sign In box. You will see the New Member Sign Up page.  3. Enter your MyChart Access Code exactly as it appears below. You will not need to use this code after you???ve completed the sign-up process. If    you do not sign up before the expiration date, you must request a new code.    MyChart Access Code: Activation code not generated  Current MyChart Status: Active (This is the date your MyChart access code will expire)    4. Enter the last four digits of your Social Security Number (xxxx) and Date of Birth (mm/dd/yyyy) as indicated and click Submit. You will be taken to the next sign-up page.  5. Create a MyChart ID. This will be your MyChart login ID and cannot be changed, so think of one that is secure and easy to remember.  6. Create a MyChart password. You can change your password at any time.  7. Enter your Password Reset Question and Answer. This can be used at a later time if you forget your password.   8. Enter your e-mail address. You will receive e-mail notification when new information is available in MyChart.  9. Click Sign Up. You can now view and download portions of your medical record.  10. Click the Download Summary menu link to download a portable copy of your medical information.    Additional Information    If you have questions, please call [phone]. Remember, MyChart is NOT to be used for urgent needs. For medical emergencies, dial 911.                   [address]  [place] [address]  Tel.  [phone]  Fax. [phone] [address]  [place] [address]  Tel.  [phone]  Fax. [phone] [address] [place] [address]  Tel.  [phone]  Fax. [phone]       Restless Legs Syndrome: After Your Visit  Your Care Instructions  Restless legs syndrome is a common nervous system problem. People with this syndrome feel a creeping, achy, or unpleasant feeling in the legs and an overpowering urge to move them. It often occurs in the evening and at night and can lead to sleep problems and tiredness.  Your doctor may suggest doing a study of your sleep patterns to figure out what is happening when you try to sleep. For many people, getting regular  exercise, eating well, and avoiding caffeine and tobacco relieve symptoms. If you try these changes and still have restless legs, medicine may help.  Follow-up care is a key part of your treatment and safety. Be sure to make and go to all appointments, and call your doctor if you are having problems. It's also a good idea to know your test results and keep a list of the medicines you take.  How can you care for yourself at home?  ?? Take your medicines exactly as prescribed. Call your doctor if you think you are having a problem with your medicine.  ?? Try bathing in very warm or very cold water. Applying a heating pad or ice bag to your legs may also help symptoms.  ?? Stretch and massage your legs before bed or when discomfort begins.  ?? Vitamin E 400IU Daily supplementation has been shown to be effective treatment  ?? Get some exercise for at least 30 minutes a day on most days of the week. Stop exercising at least 3 hours before bedtime.  ?? Try to plan for situations where you will need to remain seated for long stretches. For example, if you are traveling by car, plan some stops so you can get out and walk around.  ?? Tell your doctor   about any medicines you are taking. This includes all over-the-counter, prescription, and herbal medicines. Some medicines, such as antidepressants, antihistamines, and cold and sinus medicines, can make your symptoms worse.  ?? Avoid caffeine products, such as coffee, tea, cola, and chocolate. Caffeine can interrupt your sleep and stimulate you.  ?? Do not smoke. Nicotine can make restless legs worse. If you need help quitting, talk to your doctor about stop-smoking programs and medicines. These can increase your chances of quitting for good.  Take steps to help you sleep better  ?? Get plenty of sunlight in the outdoors, particularly later in the afternoon.  ?? Use the evening hours for settling down. Avoid activities that challenge you in the hours before bedtime.   ?? Eat meals at regular times, and do not snack before bedtime.  ?? Keep your bedroom quiet, dark, and cool. Try using a sleep mask and earplugs to help you sleep.  ?? Limit how much you drink at night to reduce your need to get up to urinate. But do not go to bed thirsty.  ?? Run a fan or other steady "white noise" during the night if noises wake you up.  ?? Reserve the bed for sleeping and sex. Do your reading or TV watching in another room.  ?? Once you are in bed, relax from head to toe, and guide your mind to pleasant thoughts.  ?? Do not stay in bed longer than 8 hours, and try to avoid naps.  ?? If your symptoms usually improve around 4 a.m. to 6 a.m., try going to bed later than usual or allowing extra time for sleeping in to help you get the rest you need.  When should you call for help?  Watch closely for changes in your health, and be sure to contact your doctor if:  ?? You are still not getting enough sleep.  ?? Your symptoms become more severe or happen more often.                   Where can you learn more?   Go to http://www.healthwise.net/[facility]  Enter X429 in the search box to learn more about "Restless Legs Syndrome: After Your Visit."   ?? 2006-2011 Healthwise, Incorporated. Care instructions adapted under license by [facility] (which disclaims liability or warranty for this information). This care instruction is for use with your licensed healthcare professional. If you have questions about a medical condition or this instruction, always ask your healthcare professional. Healthwise, Incorporated disclaims any warranty or liability for your use of this information.  Content Version: 8.9.83828; Last Revised: October 14, 2007    Restless Leg Syndrome Exercises  RLS Exercise 1  Start by getting into a standing position with your knees slightly bent, so that you are in a squat position. Your forearms should be resting on your thighs, close to your knees. You may grasp your opposite wrist for  stability if needed. While maintaining this position, raise and lower your buttocks over and over until you can no longer do so. Repeat the exercise for as long as possible without feeling muscle strain or discomfort in your back or knees.   RLS Exercise 2   Standing with your feet a little wider than shoulder width, squat down as low as you can while keeping your heels on the floor. Press your elbows against the knees. This will increase the stretch in your hips and inner thighs. Remember to keep your torso as upright as possible.     RLS Exercise 3  Hang onto a railing or wall that is near a step. Place one foot on the edge of the step with the ball of your foot. Drop your heel and contract your glutes until you feel a stretch in the back of the entire leg. Hold this position for 20 seconds and repeat with your other leg. If you notice that one calf is much tighter than the other, hold that side longer or stretch twice.   RLS Exercise 4  While laying on your back with your leg in the air or while seated in a chair with your leg extended in front of you, perform ankle circles. You can do this by drawing large circles or the alphabet with your toes. There is no need for excessive movement through the knee. Simply move the toes and pivot your ankles.     PROPER SLEEP HYGIENE    What to avoid  ?? Do not have drinks with caffeine, such as coffee or black tea, for 8 hours before bed.  ?? Do not smoke or use other types of tobacco near bedtime. Nicotine is a stimulant and can keep you awake.  ?? Avoid drinking alcohol late in the evening, because it can cause you to wake in the middle of the night.  ?? Do not eat a big meal close to bedtime. If you are hungry, eat a light snack.  ?? Do not drink a lot of water close to bedtime, because the need to urinate may wake you up during the night.  ?? Do not read or watch TV in bed. Use the bed only for sleeping and sexual activity.  What to try   ?? Go to bed at the same time every night, and wake up at the same time every morning. Do not take naps during the day.  ?? Keep your bedroom quiet, dark, and cool.  ?? Get regular exercise, but not within 3 to 4 hours of your bedtime..  ?? Sleep on a comfortable pillow and mattress.  ?? If watching the clock makes you anxious, turn it facing away from you so you cannot see the time.  ?? If you worry when you lie down, start a worry book. Well before bedtime, write down your worries, and then set the book and your concerns aside.  ?? Try meditation or other relaxation techniques before you go to bed.  ?? If you cannot fall asleep, get up and go to another room until you feel sleepy. Do something relaxing. Repeat your bedtime routine before you go to bed again.  ?? Make your house quiet and calm about an hour before bedtime. Turn down the lights, turn off the TV, log off the computer, and turn down the volume on music. This can help you relax after a busy day.    Drowsy Driving: The [place] National Highway Traffic Safety Administration cites drowsiness as a causing factor in more than 100,000 police reported crashes annually, resulting in 76,000 injuries and 1,500 deaths. Other surveys suggest 55% of people polled have driven while drowsy in the past year, 23% had fallen asleep but not crashed, 3% crashed, and 2% had and accident due to drowsy driving.  Who is at risk?   Young Drivers: One study of drowsy driving accidents states that 55% of the drivers were under 25 years. Of those, 75% were male.   Shift Workers and Travelers: People who work overnight or travel across time zones frequently are at higher risk   of experiencing Circadian Rhythm Disorders. They are trying to work and function when their body is programed to sleep.   Sleep Deprived: Lack of sleep has a serious impact on your ability to pay attention or focus on a task. Consistently getting less than the average  of 8 hours your body needs creates partial or cumulative sleep deprivation.   Untreated Sleep Disorders: Sleep Apnea, Narcolepsy, R.L.S., and other sleep disorders (untreated) prevent a person from getting enough restful sleep. This leads to excessive daytime sleepiness and increases the risk for drowsy driving accidents by up to 7 times.  Medications / Alcohol: Even over the counter medications can cause drowsiness. Medications that impair a drivers attention should have a warning label. Alcohol naturally makes you sleepy and on its own can cause accidents. Combined with excessive drowsiness its effects are amplified.   Signs of Drowsy Driving:   * You don't remember driving the last few miles   * You may drift out of your lane   * You are unable to focus and your thoughts wander   * You may yawn more often than normal   * You have difficulty keeping your eyes open / nodding off   * Missing traffic signs, speeding, or tailgating  Prevention-   Good sleep hygiene, lifestyle and behavioral choices have the most impact on drowsy driving. There is no substitute for sleep and the average person requires 8 hours nightly. If you find yourself driving drowsy, stop and sleep. Consider the sleep hygiene tips provided during your visit as well.     Medication Refill Policy: Refills for all medications require 1 week advance notice. Please have your pharmacy fax a refill request. We are unable to fax, or call in "controled substance" medications and you will need to pick these prescriptions up from our office.     MyChart Activation    Thank you for requesting access to MyChart. Please follow the instructions below to securely access and download your online medical record. MyChart allows you to send messages to your doctor, view your test results, renew your prescriptions, schedule appointments, and more.    How Do I Sign Up?    11. In your internet browser, go to [email or web address].   12. Click on the First Time User? Click Here link in the Sign In box. You will see the New Member Sign Up page.  28. Enter your MyChart Access Code exactly as it appears below. You will not need to use this code after you???ve completed the sign-up process. If you do not sign up before the expiration date, you must request a new code.    MyChart Access Code: Activation code not generated  Current MyChart Status: Active (This is the date your MyChart access code will expire)    14. Enter the last four digits of your Social Security Number (xxxx) and Date of Birth (mm/dd/yyyy) as indicated and click Submit. You will be taken to the next sign-up page.  15. Create a MyChart ID. This will be your MyChart login ID and cannot be changed, so think of one that is secure and easy to remember.  67. Create a MyChart password. You can change your password at any time.  64. Enter your Password Reset Question and Answer. This can be used at a later time if you forget your password.   70. Enter your e-mail address. You will receive e-mail notification when new information is available in [facility].  19. Click Sign Up. You can  now view and download portions of your medical record.  20. Click the Download Summary menu link to download a portable copy of your medical information.    Additional Information    If you have questions, please call [phone]. Remember, MyChart is NOT to be used for urgent needs. For medical emergencies, dial 911.    Starting a Weight Loss Plan: After Your Visit  Your Care Instructions  If you are thinking about losing weight, it can be hard to know where to start. Your doctor can help you set up a weight loss plan that best meets your needs. You may want to take a class on nutrition or exercise, or join a weight loss support group. If you have questions about how to make changes to your eating or exercise habits, ask your doctor about seeing a registered dietitian or an exercise specialist.   It can be a big challenge to lose weight. But you do not have to make huge changes at once. Make small changes, and stick with them. When those changes become habit, add a few more changes.  If you do not think you are ready to make changes right now, try to pick a date in the future. Make an appointment to see your doctor to discuss whether the time is right for you to start a plan.  Follow-up care is a key part of your treatment and safety. Be sure to make and go to all appointments, and call your doctor if you are having problems. It's also a good idea to know your test results and keep a list of the medicines you take.  How can you care for yourself at home?  ?? Set realistic goals. Many people expect to lose much more weight than is likely. A weight loss of 5% to 10% of your body weight may be enough to improve your health.  ?? Get family and friends involved to provide support. Talk to them about why you are trying to lose weight, and ask them to help. They can help by participating in exercise and having meals with you, even if they may be eating something different.  ?? Find what works best for you. If you do not have time or do not like to cook, a program that offers meal replacement bars or shakes may be better for you. Or if you like to prepare meals, finding a plan that includes daily menus and recipes may be best.  ?? Ask your doctor about other health professionals who can help you achieve your weight loss goals.  ?? A dietitian can help you make healthy changes in your diet.  ?? An exercise specialist or personal trainer can help you develop a safe and effective exercise program.  ?? A counselor or psychiatrist can help you cope with issues such as depression, anxiety, or family problems that can make it hard to focus on weight loss.  ?? Consider joining a support group for people who are trying to lose weight. Your doctor can suggest groups in your area.             Where can you learn more?    Go to [email or web address].[email or web address]://www.healthwise.net/[facility]  Enter U357 in the search box to learn more about "Starting a Weight Loss Plan: After Your Visit."   ?? 2006-2011 Healthwise, Incorporated. Care instructions adapted under license by [employer] (which disclaims liability or warranty for this information). This care instruction is for use with your licensed healthcare professional. If you have  questions about a medical condition or this instruction, always ask your healthcare professional. Healthwise, Incorporated disclaims any warranty or liability for your use of this information.  Content Version: 9.0.56994; Last Revised: September 24, 2007

## 2015-02-07 DIAGNOSIS — G4733 Obstructive sleep apnea (adult) (pediatric): Secondary | ICD-10-CM

## 2015-02-08 ENCOUNTER — Telehealth

## 2015-02-08 ENCOUNTER — Inpatient Hospital Stay: Admit: 2015-02-09 | Payer: PRIVATE HEALTH INSURANCE | Primary: Internal Medicine

## 2015-02-08 NOTE — Telephone Encounter (Addendum)
[address]  [place], [place] [id number]  Tel.  [phone]  Fax. [phone] [address]  [place], [place] [id number]  Tel.  [phone]  Fax. [phone] [address]  [place], [place] [id number]  Tel.  [phone]  Fax. [phone]   Polysomnogram was performed and the results of the study were explained to the patient.  Please refer to interpretation report for further details.  Apnea/Hypopnea index of 90 which indicates severe apnea.  He continues to have snoring.    * Treatment options were offered.  He has elected to proceed with a positive airway pressure trial (CPAP).  * We have written his PAP order  * PAP card download in 4 weeks.  PAP clinic if adherence remains poor  * Counseling was provided regarding the importance of regular PAP use and on proper sleep hygiene and safe driving.    Thank you for allowing to participate in your patient's medical care.  We'll keep you updated on these investigations.    Theodoro KosGerard Javanna Patin, M.D.  (electronically signed)  Diplomate in Sleep Medicine, ABIM

## 2015-02-08 NOTE — Telephone Encounter (Signed)
HSAT Returned    Date of Study: 02/07/15    The following information was gathered from the patients study log:    ?? Lights off: 7:45p  ?? Estimated sleep onset: unknown    ?? Awakened a total of 7-9 times  ?? The patient felt they slept 10 hours  ?? Patient took no sleep aid before starting the test  ?? Sleep quality was better compared to a usual nights sleep.    Further information provided: none

## 2015-02-09 NOTE — Progress Notes (Signed)
Faxed sleep study results to referring physician Dr Alfred Rogers

## 2015-02-09 NOTE — Progress Notes (Signed)
Order faxed to [facility].  Patient informed.

## 2015-02-25 ENCOUNTER — Encounter: Attending: Internal Medicine | Primary: Internal Medicine

## 2015-03-04 ENCOUNTER — Encounter: Attending: Internal Medicine | Primary: Internal Medicine

## 2015-04-19 ENCOUNTER — Encounter

## 2015-04-19 NOTE — Telephone Encounter (Signed)
My chart E-Mail sent.  Download in 1 month.  NocOx in 1 month.

## 2015-04-19 NOTE — Telephone Encounter (Signed)
Patient called back stating his work schedule was busy today and asked if Dr.Santos would e mail him instead of a telephone call.   [email or web address]

## 2015-05-10 NOTE — Progress Notes (Signed)
Nocturnal Oximetry order was faxed to [facility] on 05/10/15

## 2015-05-10 NOTE — Telephone Encounter (Signed)
Good adherence to PAP therapy and AHI within normal limits.  Feeling good with no complaints.    Orders Placed This Encounter   ??? AMB SUPPLY ORDER     * Nocturnal Oxygen Study on current PAP settings.    Theodoro KosGerard Kanai Berrios, M.D.  (electronically signed)  Diplomate in Sleep Medicine, [employer]IM     Theodoro KosGerard Iona Stay, M.D.  (electronically signed)  Diplomate in Sleep Medicine, ABIM

## 2015-05-11 ENCOUNTER — Encounter

## 2015-05-11 MED ORDER — CITALOPRAM 20 MG TAB
20 mg | ORAL_TABLET | ORAL | 1 refills | Status: DC
Start: 2015-05-11 — End: 2015-10-13

## 2015-05-11 MED ORDER — LISINOPRIL-HYDROCHLOROTHIAZIDE 20 MG-12.5 MG TAB
ORAL_TABLET | ORAL | 1 refills | Status: DC
Start: 2015-05-11 — End: 2015-10-13

## 2015-05-11 MED ORDER — AMLODIPINE 10 MG TAB
10 mg | ORAL_TABLET | ORAL | 1 refills | Status: DC
Start: 2015-05-11 — End: 2015-10-13

## 2015-05-27 ENCOUNTER — Encounter

## 2015-05-27 NOTE — Telephone Encounter (Signed)
Called DME company.  Spoke with [employer].  Enquired about overnight pulseoximetry order status.  York SpanielSaid will have Barbara CowerJason call us back.

## 2015-05-27 NOTE — Progress Notes (Signed)
No result from NocOx.  Order sent again.

## 2015-05-28 NOTE — Telephone Encounter (Signed)
Barbara CowerJason from [facility] called back.  He said that they have only 2 pulse ox device and patient had not returned for a while so could not schedule patient.  He does have the device now and patient will be scheduled for study on Monday, May 8th.

## 2015-06-03 ENCOUNTER — Ambulatory Visit
Admit: 2015-06-03 | Discharge: 2015-06-03 | Payer: PRIVATE HEALTH INSURANCE | Attending: Internal Medicine | Primary: Internal Medicine

## 2015-06-03 DIAGNOSIS — R600 Localized edema: Secondary | ICD-10-CM

## 2015-06-03 MED ORDER — FUROSEMIDE 20 MG TAB
20 mg | ORAL_TABLET | Freq: Every day | ORAL | 0 refills | Status: DC
Start: 2015-06-03 — End: 2016-04-05

## 2015-06-03 NOTE — Progress Notes (Signed)
HISTORY OF PRESENT ILLNESS  Daryl Morgan is a 45 y.o. male.  HPI  Problem visit:  Daryl Morgan is here for complaint of L leg swelling with pressure.  Problem began 6 day(s) ago.  Severity is marked  Character of problem: developed after plane travel just prior.  Swelling has improved somewhat over 3-4 days  nothing makes the problem worse.  nothing makes the problem better.  Associated symptoms include: no pain, redness, warmth in leg, chest pain, shortness of breath, dizziness.  Treatments tried include: medication not used  No hx of VTE.  No family hx of VTE    Patient Active Problem List   Diagnosis Code   ??? HTN (hypertension) I10   ??? Anxiety F41.9   ??? Obese E66.9   ??? OSA on CPAP G47.33, Z99.89     Past Medical History:   Diagnosis Date   ??? Hypertension    ??? Psychiatric disorder     anxiety     No Known Allergies  Current Outpatient Prescriptions on File Prior to Visit   Medication Sig Dispense Refill   ??? lisinopril-hydroCHLOROthiazide (PRINZIDE, ZESTORETIC) 20-12.5 mg per tablet Take 1 tablet by mouth  daily 90 Tab 1   ??? amLODIPine (NORVASC) 10 mg tablet Take 1 tablet by mouth  daily 90 Tab 1   ??? citalopram (CELEXA) 20 mg tablet Take 1 tablet by mouth  daily 90 Tab 1     No current facility-administered medications on file prior to visit.      Social History   Substance Use Topics   ??? Smoking status: Former Smoker     Packs/day: 0.50     Years: 30.00     Types: Cigarettes, Cigars     Quit date: 06/22/2013   ??? Smokeless tobacco: Never Used      Comment: 1/pp week   ??? Alcohol use 5.0 oz/week     10 Shots of liquor per week      Comment: social           ROS    Physical Exam   Constitutional: He appears well-developed and well-nourished. No distress.   BP 134/81 (BP 1 Location: Left arm, BP Patient Position: Sitting)   Pulse 69   Temp 98.4 ??F (36.9 ??C) (Oral)    Resp 18   Ht 6' 1.8" (1.875 m)   Wt 292 lb (132.5 kg)   SpO2 97%   BMI 37.69 kg/m2Body mass index is 37.69 kg/(m^2).   HENT:    Mouth/Throat: Oropharynx is clear and moist.   Neck: No JVD present. Carotid bruit is not present.   Cardiovascular: Normal rate, regular rhythm, normal heart sounds and intact distal pulses.    Pulses:       Posterior tibial pulses are 2+ on the right side, and 2+ on the left side.   Pulmonary/Chest: Effort normal and breath sounds normal.   Musculoskeletal: He exhibits edema.        Left upper leg: Normal.        Right lower leg: He exhibits no swelling, no edema and no deformity.        Left lower leg: He exhibits swelling (trace to 1 plus pitting).        Left foot: There is swelling (1 plus pitting). There is normal range of motion, no tenderness and no deformity.   L sided varicosities notable to knee   Neurological: He is alert.   Skin: Skin is warm and dry. He is not  diaphoretic.   Nursing note and vitals reviewed.      ASSESSMENT and PLAN  Daryl Morgan was seen today for leg swelling.    Diagnoses and all orders for this visit:    Bilateral leg edema - following air travel, increase sodium diet with hx of varicose veins, on high dose CCB.  R/o DVT with venous US now.  Add lasix low dose for several days.  Compression hose.  Treatment for DVT if US positive.  -     DUPLEX LOWER EXT VENOUS BILAT; Future  -     furosemide (LASIX) 20 mg tablet; Take 1 Tab by mouth daily.      Follow-up Disposition: Not on File

## 2015-06-07 NOTE — Telephone Encounter (Signed)
Oxygen saturation improved.    No complaints.  Denies SOB at night    No lung disease in the past    * Device pressure change to CPAP  12-20 cmH2O.    Repeat NocOx in 2 months    Theodoro KosGerard Dakari Cregger, M.D.  (electronically signed)  Diplomate in Sleep Medicine, ABIM

## 2015-06-09 ENCOUNTER — Ambulatory Visit
Admit: 2015-06-09 | Discharge: 2015-06-09 | Payer: PRIVATE HEALTH INSURANCE | Attending: Internal Medicine | Primary: Internal Medicine

## 2015-06-09 DIAGNOSIS — R21 Rash and other nonspecific skin eruption: Secondary | ICD-10-CM

## 2015-06-09 MED ORDER — TRIAMCINOLONE ACETONIDE 0.1 % TOPICAL CREAM
0.1 % | Freq: Two times a day (BID) | CUTANEOUS | 0 refills | Status: DC
Start: 2015-06-09 — End: 2015-08-11

## 2015-06-09 NOTE — Progress Notes (Signed)
Chief Complaint   Patient presents with   ??? Rash     Pt reports rash developed 4 days ago. It is not painful but 4/10 itchy.  He was on his riding tractor this weekend and may have had leaves blowing on him. There is no pain. His wife had shingles so was concerned. He did have chicken pox but did not yet get shingles vaccine due to age.      Subjective:   Ulla PotashMark Oyervides is a 45 y.o. male with hypertension.    Hypertension ROS: taking medications as instructed, no medication side effects noted, no TIA's, no chest pain on exertion, no dyspnea on exertion, no swelling of ankles.   New concerns: none.         Past Medical History:   Diagnosis Date   ??? Hypertension    ??? Psychiatric disorder     anxiety     Past Surgical History:   Procedure Laterality Date   ??? HX HEENT      uvuloectomy   ??? HX UVULOPALATOPHARYNGOPLASTY     ??? HX UVULOPALATOPHARYNGOPLASTY       Social History     Social History   ??? Marital status: MARRIED     Spouse name: N/A   ??? Number of children: N/A   ??? Years of education: N/A     Social History Main Topics   ??? Smoking status: Former Smoker     Packs/day: 0.50     Years: 30.00     Types: Cigarettes, Cigars     Quit date: 06/22/2013   ??? Smokeless tobacco: Never Used      Comment: 1/pp week   ??? Alcohol use 5.0 oz/week     10 Shots of liquor per week      Comment: social   ??? Drug use: Yes     Special: Hallucinogenics, Marijuana, Cocaine      Comment: NO IVDA,  drugs in past only. Quit 2004   ??? Sexual activity: Yes     Other Topics Concern   ??? None     Social History Narrative     Family History   Problem Relation Age of Onset   ??? Cancer Maternal Grandmother    ??? Diabetes Neg Hx    ??? Hypertension Neg Hx      Current Outpatient Prescriptions   Medication Sig Dispense Refill   ??? triamcinolone acetonide (KENALOG) 0.1 % topical cream Apply  to affected area two (2) times a day. use thin layer stop as soon as sx resolve 1 week 15 g 0   ??? furosemide (LASIX) 20 mg tablet Take 1 Tab by mouth daily. 5 Tab 0    ??? lisinopril-hydroCHLOROthiazide (PRINZIDE, ZESTORETIC) 20-12.5 mg per tablet Take 1 tablet by mouth  daily 90 Tab 1   ??? amLODIPine (NORVASC) 10 mg tablet Take 1 tablet by mouth  daily 90 Tab 1   ??? citalopram (CELEXA) 20 mg tablet Take 1 tablet by mouth  daily 90 Tab 1   ??? loratadine (CLARITIN) 10 mg tablet Take 10 mg by mouth daily as needed for Allergies.       No Known Allergies    Review of Systems - General ROS: negative for - chills, fatigue, fever, hot flashes, malaise or night sweats  Cardiovascular ROS: no chest pain or dyspnea on exertion  Respiratory ROS: no cough, shortness of breath, or wheezing    Visit Vitals   ??? BP 117/89 (BP 1 Location: Left arm,  BP Patient Position: Sitting)   ??? Pulse 85   ??? Temp 98.5 ??F (36.9 ??C) (Oral)   ??? Resp 20   ??? Ht 6' (1.829 m)   ??? Wt 289 lb (131.1 kg)   ??? SpO2 98%   ??? BMI 39.2 kg/m2     General Appearance:  Well developed, well nourished,alert and oriented x 3, and individual in no acute distress.   Ears/Nose/Mouth/Throat:   Hearing grossly normal.         Neck: Supple, no lad, no bruits   Chest:   Lungs clear to auscultation bilaterally.   Cardiovascular:  Regular rate and rhythm, S1, S2 normal, no murmur.   Abdomen:   Soft, non-tender, bowel sounds are active.   Extremities: No edema bilaterally.    Skin: Warm and dry, no suspicious lesions   Erythema on right lateral side x 2 patches separate and isolated papules, + erythema but no distinct blistering, crops of blisters, teardrop on erythematous base, other lesions scattered rather than clustered                 Braydan was seen today for rash.    Diagnoses and all orders for this visit:    Rash  I think this may be a contact dermatitis versus an early zoster. Not clearly distinct. Discussed with pt and will use steroid on small area and improves can continue if area does become blistered (wife had shingles so aware) will let me know and I will send in rx for valtrex oral rx. Contact  precautions discussed. He will let me know through mychart-with respect to development of pain and blistering as possible eruption zoster.    -     triamcinolone acetonide (KENALOG) 0.1 % topical cream; Apply  to affected area two (2) times a day. use thin layer stop as soon as sx resolve 1 week      htn  Stable cont meds    This note will not be viewable in MyChart.

## 2015-06-09 NOTE — Patient Instructions (Signed)
Dermatitis: Care Instructions  Your Care Instructions  Dermatitis is the general name used for any rash or inflammation of the skin. Different kinds of dermatitis cause different kinds of rashes. Common causes of a rash include new medicines, plants (such as poison oak or poison ivy), heat, and stress. Certain illnesses can also cause a rash.  An allergic reaction to something that touches your skin, such as latex, nickel, or poison ivy, is called contact dermatitis. Contact dermatitis may also be caused by something that irritates the skin, such as bleach, a chemical, or soap. These types of rashes cannot be spread from person to person.  How long your rash will last depends on what caused it. Rashes may last a few days or months.  Follow-up care is a key part of your treatment and safety. Be sure to make and go to all appointments, and call your doctor if you are having problems. It's also a good idea to know your test results and keep a list of the medicines you take.  How can you care for yourself at home?  ?? Do not scratch the rash. Cut your nails short, and file them smooth. Or wear gloves if this helps keep you from scratching.  ?? Wash the area with water only. Pat dry.  ?? Put cold, wet cloths on the rash to reduce itching.  ?? Keep cool, and stay out of the sun.  ?? Leave the rash open to the air as much as possible.  ?? If the rash itches, use hydrocortisone cream. Follow the directions on the label. Calamine lotion may help for plant rashes.  ?? Take an over-the-counter antihistamine, such as diphenhydramine (Benadryl) or loratadine (Claritin), to help calm the itching. Read and follow all instructions on the label.  ?? If your doctor prescribed a cream, use it as directed. If your doctor prescribed medicine, take it exactly as directed.  When should you call for help?  Call your doctor now or seek immediate medical care if:  ?? You have symptoms of infection, such as:   ?? Increased pain, swelling, warmth, or redness.  ?? Red streaks leading from the area.  ?? Pus draining from the area.  ?? A fever.  ?? You have joint pain along with the rash.  Watch closely for changes in your health, and be sure to contact your doctor if:  ?? Your rash is changing or getting worse.  ?? You are not getting better as expected.  Where can you learn more?  Go to http://www.healthwise.net/[facility].  Enter F270 in the search box to learn more about "Dermatitis: Care Instructions."  Current as of: November 05, 2014  Content Version: 11.2  ?? 2006-2017 Healthwise, Incorporated. Care instructions adapted under license by [employer] (which disclaims liability or warranty for this information). If you have questions about a medical condition or this instruction, always ask your healthcare professional. Healthwise, Incorporated disclaims any warranty or liability for your use of this information.

## 2015-06-11 ENCOUNTER — Encounter

## 2015-07-21 ENCOUNTER — Encounter: Attending: Internal Medicine | Primary: Internal Medicine

## 2015-08-05 ENCOUNTER — Encounter

## 2015-08-05 NOTE — Progress Notes (Signed)
Orders Placed This Encounter   ??? AMB SUPPLY ORDER     * Nocturnal Oxygen Study on current PAP settings.    Ketara Cavness, M.D.  (electronically signed)  Diplomate in Sleep Medicine, AB[employer]

## 2015-08-05 NOTE — Progress Notes (Signed)
Faxed Pulse oximetry order to [facility]

## 2015-08-11 ENCOUNTER — Ambulatory Visit
Admit: 2015-08-11 | Discharge: 2015-08-11 | Payer: PRIVATE HEALTH INSURANCE | Attending: Internal Medicine | Primary: Internal Medicine

## 2015-08-11 DIAGNOSIS — Z0000 Encounter for general adult medical examination without abnormal findings: Secondary | ICD-10-CM

## 2015-08-11 NOTE — Patient Instructions (Signed)
Well Visit, Ages 18 to 50: Care Instructions  Your Care Instructions  Physical exams can help you stay healthy. Your doctor has checked your overall health and may have suggested ways to take good care of yourself. He or she also may have recommended tests. At home, you can help prevent illness with healthy eating, regular exercise, and other steps.  Follow-up care is a key part of your treatment and safety. Be sure to make and go to all appointments, and call your doctor if you are having problems. It's also a good idea to know your test results and keep a list of the medicines you take.  How can you care for yourself at home?  ?? Reach and stay at a healthy weight. This will lower your risk for many problems, such as obesity, diabetes, heart disease, and high blood pressure.  ?? Get at least 30 minutes of physical activity on most days of the week. Walking is a good choice. You also may want to do other activities, such as running, swimming, cycling, or playing tennis or team sports. Discuss any changes in your exercise program with your doctor.  ?? Do not smoke or allow others to smoke around you. If you need help quitting, talk to your doctor about stop-smoking programs and medicines. These can increase your chances of quitting for good.  ?? Talk to your doctor about whether you have any risk factors for sexually transmitted infections (STIs). Having one sex partner (who does not have STIs and does not have sex with anyone else) is a good way to avoid these infections.  ?? Use birth control if you do not want to have children at this time. Talk with your doctor about the choices available and what might be best for you.  ?? Protect your skin from too much sun. When you're outdoors from 10 a.m. to 4 p.m., stay in the shade or cover up with clothing and a hat with a wide brim. Wear sunglasses that block UV rays. Even when it's cloudy, put broad-spectrum sunscreen (SPF 30 or higher) on any exposed skin.   ?? See a dentist one or two times a year for checkups and to have your teeth cleaned.  ?? Wear a seat belt in the car.  ?? Drink alcohol in moderation, if at all. That means no more than 2 drinks a day for men and 1 drink a day for women.  Follow your doctor's advice about when to have certain tests. These tests can spot problems early.  For everyone  ?? Cholesterol. Have the fat (cholesterol) in your blood tested after age 20. Your doctor will tell you how often to have this done based on your age, family history, or other things that can increase your risk for heart disease.  ?? Blood pressure. Have your blood pressure checked during a routine doctor visit. Your doctor will tell you how often to check your blood pressure based on your age, your blood pressure results, and other factors.  ?? Vision. Talk with your doctor about how often to have a glaucoma test.  ?? Diabetes. Ask your doctor whether you should have tests for diabetes.  ?? Colon cancer. Have a test for colon cancer at age 50. You may have one of several tests. If you are younger than 50, you may need a test earlier if you have any risk factors. Risk factors include whether you already had a precancerous polyp removed from your colon or whether your parent, brother,   sister, or child has had colon cancer.  For women  ?? Breast exam and mammogram. Talk to your doctor about when you should have a clinical breast exam and a mammogram. Medical experts differ on whether and how often women under 50 should have these tests. Your doctor can help you decide what is right for you.  ?? Pap test and pelvic exam. Begin Pap tests at age 21. A Pap test is the best way to find cervical cancer. The test often is part of a pelvic exam. Ask how often to have this test.  ?? Tests for sexually transmitted infections (STIs). Ask whether you should have tests for STIs. You may be at risk if you have sex with more than one person, especially if your partners do not wear condoms.   For men  ?? Tests for sexually transmitted infections (STIs). Ask whether you should have tests for STIs. You may be at risk if you have sex with more than one person, especially if you do not wear a condom.  ?? Testicular cancer exam. Ask your doctor whether you should check your testicles regularly.  ?? Prostate exam. Talk to your doctor about whether you should have a blood test (called a PSA test) for prostate cancer. Experts differ on whether and when men should have this test. Some experts suggest it if you are older than 45 and are African-American or have a father or brother who got prostate cancer when he was younger than 65.  When should you call for help?  Watch closely for changes in your health, and be sure to contact your doctor if you have any problems or symptoms that concern you.  Where can you learn more?  Go to http://www.healthwise.net/[facility].  Enter P072 in the search box to learn more about "Well Visit, Ages 18 to 50: Care Instructions."  Current as of: August 11, 2014  Content Version: 11.3  ?? 2006-2017 Healthwise, Inc[employer]. Care instructions adapted under license by [employer] (which disclaims liability or warranty for this information). If you have questions about a medical condition or this instruction, always ask your healthcare professional. [employer]wise, Inc[employer] disclaims any warranty or liability for your use of this information.

## 2015-08-11 NOTE — Progress Notes (Signed)
[facility]  Timeout Progress Note    Chart reviewed for allergies/reaction to any medications.    TIMEOUT initiated prior to start of procedure:       Yes No: yes Patient identified by name and date of birth     Yes No: yes Informed consent obtained     Yes No: yes Procedure site marked and verified     Yes No: yes Procedure to be performed verified, patient confirms understanding     Yes No: yes Pain assessment pre-procedure - Pain 0/10     Yes No: yes Pain assessment post-procedure - Pain 0/10     Yes No: yes Patient education provided     Yes No: yes Post-procedure instructions provided to patient     Yes No: yes Patient verbalized understanding of procedure, education provided, and post-procedure instructions.     Consent form signed and verified.      Patient tolerated procedure well.

## 2015-08-11 NOTE — Progress Notes (Signed)
HISTORY OF PRESENT ILLNESS  Daryl Morgan is a 45 y.o. male.  HPI  Daryl Morgan is here for complete health maintenance physical exam and screening.  he does not have other concerns.    He is actively losing wt with near [employer], low carb and good routine exercise    Health maintenance hx includes:  Exercise: very active.  Form of exercise: walking, running   Diet: generally follows a low fat low cholesterol diet, generally follows a low sodium diet, exercises regularly, nonsmoker         Lab Results   Component Value Date/Time    Cholesterol, total 165 01/20/2015 09:38 AM    HDL Cholesterol 44 01/20/2015 09:38 AM    LDL, calculated 96 01/20/2015 09:38 AM    VLDL, calculated 25 01/20/2015 09:38 AM    Triglyceride 124 01/20/2015 09:38 AM       Lab Results   Component Value Date/Time    Glucose 89 01/20/2015 09:38 AM         Immunizations:     Immunization History   Administered Date(s) Administered   ??? Influenza Vaccine 11/15/2014      Immunization status: due today.       Social History     Social History   ??? Marital status: MARRIED     Spouse name: N/A   ??? Number of children: N/A   ??? Years of education: N/A     Occupational History   ??? Not on file.     Social History Main Topics   ??? Smoking status: Former Smoker     Packs/day: 0.50     Years: 30.00     Types: Cigarettes, Cigars     Quit date: 06/22/2013   ??? Smokeless tobacco: Never Used      Comment: 1/pp week   ??? Alcohol use 5.0 oz/week     10 Shots of liquor per week      Comment: social   ??? Drug use: Yes     Special: Hallucinogenics, Marijuana, Cocaine      Comment: NO IVDA,  drugs in past only. Quit 2004   ??? Sexual activity: Yes     Other Topics Concern   ??? Not on file     Social History Narrative     Past Surgical History:   Procedure Laterality Date   ??? HX HEENT      uvuloectomy   ??? HX UVULOPALATOPHARYNGOPLASTY     ??? HX UVULOPALATOPHARYNGOPLASTY       Family History   Problem Relation Age of Onset   ??? Cancer Maternal Grandmother    ??? Diabetes Neg Hx     ??? Hypertension Neg Hx      Current Outpatient Prescriptions on File Prior to Visit   Medication Sig Dispense Refill   ??? loratadine (CLARITIN) 10 mg tablet Take 10 mg by mouth daily as needed for Allergies.     ??? furosemide (LASIX) 20 mg tablet Take 1 Tab by mouth daily. 5 Tab 0   ??? lisinopril-hydroCHLOROthiazide (PRINZIDE, ZESTORETIC) 20-12.5 mg per tablet Take 1 tablet by mouth  daily 90 Tab 1   ??? amLODIPine (NORVASC) 10 mg tablet Take 1 tablet by mouth  daily 90 Tab 1   ??? citalopram (CELEXA) 20 mg tablet Take 1 tablet by mouth  daily 90 Tab 1     No current facility-administered medications on file prior to visit.      .    Review of Systems  Constitutional: Negative for malaise/fatigue and weight loss.   Eyes: Negative for blurred vision and pain.   Respiratory: Negative for cough, shortness of breath and wheezing.    Cardiovascular: Negative for chest pain, palpitations and leg swelling.   Gastrointestinal: Negative for blood in stool, constipation, diarrhea, heartburn, nausea and vomiting.   Genitourinary: Negative.    Musculoskeletal: Negative for back pain, joint pain and myalgias.   Skin: Negative for rash.   Neurological: Negative for dizziness and headaches.   Endo/Heme/Allergies: Negative for environmental allergies. Does not bruise/bleed easily.   Psychiatric/Behavioral: Negative for depression. The patient is not nervous/anxious and does not have insomnia.        Physical Exam   Constitutional: He is oriented to person, place, and time. He appears well-developed and well-nourished. No distress.   Body mass index is 39.09 kg/(m^2).BP 128/82 (BP 1 Location: Left arm, BP Patient Position: Sitting)   Pulse 60   Temp 98.4 ??F (36.9 ??C) (Oral)    Resp 18   Ht 6' (1.829 m)   Wt 288 lb 4 oz (130.7 kg)   SpO2 98%   BMI 39.09 kg/m2   HENT:   Head: Normocephalic and atraumatic.   Right Ear: Hearing, tympanic membrane and ear canal normal.   Left Ear: Hearing, tympanic membrane and ear canal normal.    Nose: Nose normal.   Mouth/Throat: Oropharynx is clear and moist and mucous membranes are normal. Normal dentition.   Eyes: Conjunctivae and lids are normal. Pupils are equal, round, and reactive to light. Right eye exhibits no discharge. Left eye exhibits no discharge. No scleral icterus.   Neck: Trachea normal. No thyromegaly present.   Cardiovascular: Normal rate, regular rhythm, normal heart sounds, intact distal pulses and normal pulses.  Exam reveals no gallop and no friction rub.    No murmur heard.  Pulmonary/Chest: Effort normal and breath sounds normal. No respiratory distress.   Abdominal: Soft. Normal appearance and bowel sounds are normal. He exhibits no distension and no mass. There is no hepatosplenomegaly. There is no tenderness. There is no CVA tenderness.   Musculoskeletal: Normal range of motion. He exhibits no edema or tenderness.   Lymphadenopathy:     He has no cervical adenopathy.        Right: No inguinal adenopathy present.        Left: No inguinal adenopathy present.   Neurological: He is alert and oriented to person, place, and time.   Skin: Skin is warm and dry. No rash noted. He is not diaphoretic.   Psychiatric: He has a normal mood and affect. His speech is normal and behavior is normal. Judgment and thought content normal. Cognition and memory are normal.   Nursing note and vitals reviewed.      ASSESSMENT and PLAN  Daryl Morgan was seen today for well male.    Diagnoses and all orders for this visit:    Preventative health care  Daryl Morgan was counseled on age-appropriate/ guideline-based risk prevention behaviors and screening for a 45 y.o. year old   male .  We also discussed adjustments in screening based on family history if necessary.   Printed instructions for preventative screening guidelines and healthy behaviors given to patient with after visit summary.          Encounter for immunization  -     Tetanus, diphtheria toxoids and acellular pertussis (TDAP) vaccine,  in individuals >=7 years, IM  -     PR IMMUNIZ ADMIN,1 SINGLE/COMB VAC/TOXOID  Follow-up Disposition:  Return in about 6 months (around 02/11/2016).

## 2015-10-13 ENCOUNTER — Encounter

## 2015-10-13 MED ORDER — LISINOPRIL-HYDROCHLOROTHIAZIDE 20 MG-12.5 MG TAB
ORAL_TABLET | Freq: Every day | ORAL | 1 refills | Status: DC
Start: 2015-10-13 — End: 2016-06-05

## 2015-10-13 MED ORDER — AMLODIPINE 10 MG TAB
10 mg | ORAL_TABLET | Freq: Every day | ORAL | 1 refills | Status: DC
Start: 2015-10-13 — End: 2016-06-05

## 2015-10-13 NOTE — Telephone Encounter (Signed)
From: [name]  To: Ezekiel SlocumbAlfred H Rogers, MD  Sent: 10/13/2015 11:08 AM EDT  Subject:  Medication Renewal Request    Original  authorizing provider: Ezekiel SlocumbAlfred H Rogers, MD    [name] would like a refill of the following medications:  lisinopril-hydroCHLOROthiazide  (PRINZIDE, ZESTORETIC) 20-12.5 mg per tablet Ezekiel Slocumb[Alfred H Rogers, MD]  amLODIPine  (NORVASC) 10 mg tablet Ezekiel Slocumb[Alfred H Rogers, MD]  citalopram  (CELEXA) 20 mg tablet Ezekiel Slocumb[Alfred H Rogers, MD]    [facility]  [facility] [place] - [id number]    Comment:  Hello!  I just used my last 90-day refill. Would you please be so kind as to refill these 3 meds? Thanks! [name]

## 2015-10-15 MED ORDER — CITALOPRAM 20 MG TAB
20 mg | ORAL_TABLET | Freq: Every day | ORAL | 1 refills | Status: DC
Start: 2015-10-15 — End: 2016-06-05

## 2015-11-20 ENCOUNTER — Inpatient Hospital Stay
Admit: 2015-11-20 | Discharge: 2015-11-20 | Disposition: A | Discharge status: Home / Self Care | Payer: PRIVATE HEALTH INSURANCE | Attending: Emergency Medicine

## 2015-11-20 DIAGNOSIS — S0195XA Open bite of unspecified part of head, initial encounter: Secondary | ICD-10-CM

## 2015-11-20 MED ORDER — TRAMADOL 50 MG TAB
50 mg | ORAL_TABLET | Freq: Four times a day (QID) | ORAL | 0 refills | Status: AC | PRN
Start: 2015-11-20 — End: 2015-11-30

## 2015-11-20 MED ORDER — LIDOCAINE-EPINEPHRINE 1 %-1:100,000 IJ SOLN
1 %-:00,000 | Freq: Once | INTRAMUSCULAR | Status: DC
Start: 2015-11-20 — End: 2015-11-20

## 2015-11-20 MED ORDER — AMOXICILLIN CLAVULANATE 875 MG-125 MG TAB
875-125 mg | ORAL_TABLET | Freq: Two times a day (BID) | ORAL | 0 refills | Status: DC
Start: 2015-11-20 — End: 2016-04-05

## 2015-11-20 MED FILL — XYLOCAINE WITH EPINEPHRINE 1 %-1:100,000 INJECTION SOLUTION: 1 %-:00,000 | INTRAMUSCULAR | Qty: 20

## 2015-11-20 NOTE — ED Notes (Signed)
Patient discharged by NP. pt given the opportunity to ask questions, verbalized understanding of teaching.

## 2015-11-20 NOTE — ED Triage Notes (Signed)
Patient was bit by sisters dog, puncture wound to chin, no active bleeding. Tetanus is UTD.

## 2015-11-20 NOTE — ED Provider Notes (Signed)
HPI Comments: Daryl Morgan is a 45 yo WM presenting to ED via car with c/o dog bite just PTA.  Pt was bitten. By a dog (dog's immunizations and rabies are UTD).  Pt states that he cleaned the wound out a little PTA and that he is not in any pain now.  Pt with no fever, chills, CP, N/V or diarrhea.  There are no other complaints at this time.    PCP: Ezekiel Slocumb, MD    There were no other complaints, changes, physical findings at this time.     Past Medical History:  No date: Hypertension  No date: Psychiatric disorder      Comment: anxiety  No date: Sleep apnea      Patient is a 45 y.o. male presenting with dog bite. The history is provided by the patient.   Dog Bite    The incident occurred just prior to arrival. The incident occurred at home. He came to the ER via personal transport. There is an injury to the head. The pain is mild. It is unlikely that a foreign body is present. Pertinent negatives include no abdominal pain, no nausea, no vomiting, no headaches, no neck pain and no weakness. His tetanus status is UTD. There were no sick contacts.        Past Medical History:   Diagnosis Date   ??? Hypertension    ??? Psychiatric disorder     anxiety   ??? Sleep apnea        Past Surgical History:   Procedure Laterality Date   ??? HX HEENT      uvuloectomy   ??? HX UVULOPALATOPHARYNGOPLASTY     ??? HX UVULOPALATOPHARYNGOPLASTY           Family History:   Problem Relation Age of Onset   ??? Cancer Maternal Grandmother    ??? Diabetes Neg Hx    ??? Hypertension Neg Hx        Social History     Social History   ??? Marital status: MARRIED     Spouse name: N/A   ??? Number of children: N/A   ??? Years of education: N/A     Occupational History   ??? Not on file.     Social History Main Topics   ??? Smoking status: Current Every Day Smoker     Packs/day: 0.25     Years: 30.00     Types: Cigarettes, Cigars     Last attempt to quit: 06/22/2013   ??? Smokeless tobacco: Never Used      Comment: 1/pp week   ??? Alcohol use 12.0 oz/week      20 Shots of liquor per week   ??? Drug use: No      Comment: NO IVDA,  drugs in past only. Quit 2004   ??? Sexual activity: Yes     Other Topics Concern   ??? Not on file     Social History Narrative         ALLERGIES: Review of patient's allergies indicates no known allergies.    Review of Systems   Constitutional: Negative.  Negative for chills, diaphoresis and fever.   HENT: Negative.  Negative for congestion, rhinorrhea and trouble swallowing.    Eyes: Negative.    Respiratory: Negative.  Negative for shortness of breath.    Cardiovascular: Negative.    Gastrointestinal: Negative.  Negative for abdominal pain, nausea and vomiting.   Endocrine: Negative.    Musculoskeletal: Negative for  arthralgias, myalgias, neck pain and neck stiffness.   Skin: Positive for wound. Negative for rash.   Allergic/Immunologic: Negative.    Neurological: Negative.  Negative for dizziness, syncope, weakness and headaches.   Hematological: Negative.    Psychiatric/Behavioral: Negative.        Vitals:    11/20/15 1000 11/20/15 1015 11/20/15 1030 11/20/15 1045   BP: 143/84 (!) 145/92 138/77    Pulse:       Resp:       Temp:       SpO2: 93% 99% 96% 99%   Weight:       Height:                Physical Exam   Constitutional: He is oriented to person, place, and time. Vital signs are normal. He appears well-developed and well-nourished.  Non-toxic appearance. He does not have a sickly appearance. He does not appear ill.   HENT:   Head: Normocephalic and atraumatic.   Eyes: Conjunctivae, EOM and lids are normal. Pupils are equal, round, and reactive to light.   Neck: Trachea normal, normal range of motion and full passive range of motion without pain. Neck supple.       See attached photos.   Cardiovascular: Normal rate, regular rhythm, normal heart sounds and normal pulses.    Pulmonary/Chest: Effort normal and breath sounds normal.   Abdominal: Soft. Normal appearance and bowel sounds are normal.   Musculoskeletal: Normal range of motion.    Neurological: He is alert and oriented to person, place, and time. He has normal strength. GCS eye subscore is 4. GCS verbal subscore is 5. GCS motor subscore is 6.   Skin: Skin is warm, dry and intact.   Psychiatric: He has a normal mood and affect. His speech is normal and behavior is normal. Judgment and thought content normal. Cognition and memory are normal.   Nursing note and vitals reviewed.                MDM  ED Course       Procedures     Procedure Note - Laceration Repair:  10:21 AM  Procedure by Dr Aileen PilotHastings with Jones Balesennis C. Pag?? FNP-BC assisting.  Complexity: simple  0.5 cm linear laceration to chin  was irrigated copiously with NS under jet lavage, prepped with Betadine and draped in a sterile fashion.  The area was anesthetized with 3 mLs of  Lidocaine 1% with epinephrine via local infiltration.  The wound was explored with the following results: No foreign bodies found.  The wound was repaired with One layer suture closure: Skin Layer:  1 sutures placed, stitch type:simple interrupted, suture: 5-0 nylon..  The wound was closed with good hemostasis and loose approximation.  Sterile dressing applied.  Estimated blood loss: 2 cc's  The procedure took 1-15 minutes, and pt tolerated well.    LABORATORY TESTS:  No results found for this or any previous visit (from the past 12 hour(s)).    IMAGING RESULTS:    CT Results  (Last 48 hours)    None        PFT Results  (Last 48 hours)    None        Echo Results  (Last 48 hours)    None        CXR Results  (Last 48 hours)    None        VENOUS DOPPLER results  (Last 48 hours)    None  MEDICATIONS GIVEN:  Medications   lidocaine-EPINEPHrine (XYLOCAINE) 1 %-1:100,000 injection 15 mg (not administered)       IMPRESSION:  1. Dog bite, initial encounter        PLAN:  1. Augmentin 875 PO BID x 7 days  2. Suture removal in 4-6 days  Return to ED if worse    Discharge Note  10:40 AM  The patient is ready for discharge. The patient's signs, symptoms,  diagnosis, and discharge instructions have been discussed and the patient has conveyed their understanding. The patient is to follow up as recommended or return to the ER should their symptoms worsen. Plan has been discussed and the patient is in agreement.    Jones Balesennis C. Pag?? FNP-BC.

## 2016-02-11 ENCOUNTER — Encounter: Attending: Internal Medicine | Primary: Internal Medicine

## 2016-04-05 ENCOUNTER — Ambulatory Visit
Admit: 2016-04-05 | Discharge: 2016-04-05 | Payer: PRIVATE HEALTH INSURANCE | Attending: Internal Medicine | Primary: Internal Medicine

## 2016-04-05 DIAGNOSIS — Z0000 Encounter for general adult medical examination without abnormal findings: Secondary | ICD-10-CM

## 2016-04-05 NOTE — Progress Notes (Signed)
HISTORY OF PRESENT ILLNESS  Daryl Morgan is a 46 y.o. male.  HPI  Daryl Morgan is here for complete health maintenance physical exam and screening.  he does have other concerns.    Hypertension:  Daryl Morgan is a 46 y.o. male with hypertension.    without Chronic kidney disease stage    Medication change since last visit: No  The patient reports:  taking medications as instructed, no medication side effects noted, home BP monitoring in range of 120's systolic over 70's diastolic, no chest pain on exertion, no dyspnea on exertion, no swelling of ankles, no orthostatic dizziness or lightheadedness, no palpitations.       Lifestyle modification/social history: generally follows a low fat low cholesterol diet, exercises sporadically, nonsmoker    Lab Results   Component Value Date/Time    Sodium 143 01/20/2015 09:38 AM    Potassium 4.0 01/20/2015 09:38 AM    Chloride 100 01/20/2015 09:38 AM    CO2 26 01/20/2015 09:38 AM    Glucose 89 01/20/2015 09:38 AM    BUN 10 01/20/2015 09:38 AM    Creatinine 0.94 01/20/2015 09:38 AM    BUN/Creatinine ratio 11 01/20/2015 09:38 AM    GFR est AA 113 01/20/2015 09:38 AM    GFR est non-AA 98 01/20/2015 09:38 AM    Calcium 9.0 01/20/2015 09:38 AM       obstructive sleep apnea -   he currently is using CPAP/oral appliance every night.  he does not report daytime hypersomnolence.  he does not report am headaches.  Compliance with CPAP/ oral appliance is reinforced with him.  he  does follow up with a sleep specialist - .  He does report some anxiety still when initially putting on mask causing some insomnia for 1 hour or so at night.     Anxiety :  Patient is seen for anxiety disorder. Current treatment includes Celexa and no other therapies. Reported side effects from the medication: none.  Ongoing symptoms include: as above otherwise notes good control.   he does feel treatment is effective.  Patient denies: chest pain, shortness of breath, dizziness.  Depression is  not associated with his anxiety.                Health maintenance hx includes:  Exercise: moderately active.  Form of exercise: walk/jog   Diet: generally follows a low fat low cholesterol diet, generally follows a low sodium diet, exercises sporadically, nonsmoker         Lab Results   Component Value Date/Time    Cholesterol, total 165 01/20/2015 09:38 AM    HDL Cholesterol 44 01/20/2015 09:38 AM    LDL, calculated 96 01/20/2015 09:38 AM    VLDL, calculated 25 01/20/2015 09:38 AM    Triglyceride 124 01/20/2015 09:38 AM       Lab Results   Component Value Date/Time    Glucose 89 01/20/2015 09:38 AM         Immunizations:     Immunization History   Administered Date(s) Administered   ??? Influenza Vaccine 11/15/2014, 10/24/2015   ??? Tdap 08/11/2015      Immunization status: up to date and documented.       Social History     Social History   ??? Marital status: MARRIED     Spouse name: N/A   ??? Number of children: N/A   ??? Years of education: N/A     Occupational History   ??? Not on file.  Social History Main Topics   ??? Smoking status: Former Smoker     Packs/day: 0.25     Years: 30.00     Types: Cigarettes, Cigars     Quit date: 06/22/2013   ??? Smokeless tobacco: Never Used   ??? Alcohol use 6.0 - 9.0 oz/week     10 - 15 Shots of liquor per week   ??? Drug use: No      Comment: NO IVDA,  drugs in past only. Quit 2004   ??? Sexual activity: Yes     Other Topics Concern   ??? Not on file     Social History Narrative     Past Surgical History:   Procedure Laterality Date   ??? HX HEENT      uvuloectomy   ??? HX UVULOPALATOPHARYNGOPLASTY     ??? HX UVULOPALATOPHARYNGOPLASTY       Family History   Problem Relation Age of Onset   ??? Cancer Maternal Grandmother    ??? Diabetes Neg Hx    ??? Hypertension Neg Hx      Current Outpatient Prescriptions on File Prior to Visit   Medication Sig Dispense Refill   ??? citalopram (CELEXA) 20 mg tablet Take 1 Tab by mouth daily. 90 Tab 1   ??? lisinopril-hydroCHLOROthiazide (PRINZIDE, ZESTORETIC) 20-12.5 mg per  tablet Take 1 Tab by mouth daily. 90 Tab 1   ??? amLODIPine (NORVASC) 10 mg tablet Take 1 Tab by mouth daily. 90 Tab 1   ??? loratadine (CLARITIN) 10 mg tablet Take 10 mg by mouth daily as needed for Allergies.       No current facility-administered medications on file prior to visit.      .    Review of Systems   Constitutional: Negative for malaise/fatigue and weight loss.   Eyes: Negative for blurred vision and pain.   Respiratory: Negative for cough, shortness of breath and wheezing.    Cardiovascular: Negative for chest pain, palpitations and leg swelling.   Gastrointestinal: Negative for blood in stool, constipation, diarrhea, heartburn, nausea and vomiting.   Genitourinary: Negative.    Musculoskeletal: Negative for back pain, joint pain and myalgias.   Skin: Negative for rash.   Neurological: Negative for dizziness and headaches.   Endo/Heme/Allergies: Negative for environmental allergies. Does not bruise/bleed easily.   Psychiatric/Behavioral: Negative for depression. The patient is not nervous/anxious and does not have insomnia.        Physical Exam   Constitutional: He is oriented to person, place, and time. He appears well-developed and well-nourished. No distress.   Body mass index is 39.79 kg/(m^2).BP 125/86 (BP 1 Location: Left arm, BP Patient Position: Sitting)   Pulse 76   Temp 98.7 ??F (37.1 ??C) (Oral)    Resp 18   Ht 6' (1.829 m)   Wt 293 lb 6 oz (133.1 kg)   SpO2 96%   BMI 39.79 kg/m2   HENT:   Head: Normocephalic and atraumatic.   Right Ear: Hearing normal.   Left Ear: Hearing normal.   Nose: Nose normal.   Mouth/Throat: Oropharynx is clear and moist and mucous membranes are normal. Normal dentition.   Eyes: Conjunctivae and lids are normal. Pupils are equal, round, and reactive to light. Right eye exhibits no discharge. Left eye exhibits no discharge. No scleral icterus.   Neck: Trachea normal. No thyromegaly present.   Cardiovascular: Normal rate, regular rhythm, normal heart sounds, intact  distal pulses and normal pulses.  Exam reveals no gallop and no  friction rub.    No murmur heard.  Pulmonary/Chest: Effort normal and breath sounds normal. No respiratory distress.   Abdominal: Soft. Normal appearance and bowel sounds are normal. He exhibits no distension and no mass. There is no hepatosplenomegaly. There is no tenderness. There is no CVA tenderness.   Musculoskeletal: Normal range of motion. He exhibits no edema or tenderness.   Lymphadenopathy:     He has no cervical adenopathy.   Neurological: He is alert and oriented to person, place, and time.   Skin: Skin is warm and dry. No rash noted. He is not diaphoretic.   Psychiatric: He has a normal mood and affect. His speech is normal and behavior is normal. Judgment and thought content normal. Cognition and memory are normal.   Nursing note and vitals reviewed.      ASSESSMENT and PLAN  Diagnoses and all orders for this visit:    1. Preventative health care  -     LIPID PANEL  Daryl Morgan was counseled on age-appropriate/ guideline-based risk prevention behaviors and screening for a 46 y.o. year old   male .  We also discussed adjustments in screening based on family history if necessary.   Printed instructions for preventative screening guidelines and healthy behaviors given to patient with after visit summary.          2. Essential hypertension -Well controlled and stable.  his medications were reviewed and refilled where necessary as noted below.  Labs ordered as noted.    -     METABOLIC PANEL, BASIC    3. OSA on CPAP -Daryl Morgan 's sleep apnea appears controlled by history.  he reports good compliance with CPAP or oral appliance prescribed by his sleep specialist.  No changes were made today.      4. Class 2 obesity due to excess calories without serious comorbidity with body mass index (BMI) of 39.0 to 39.9 in adult    5. Psychophysiological insomnia  Try melatonin at night  Follow-up Disposition:   Return in about 6 months (around 10/06/2016).

## 2016-04-06 LAB — METABOLIC PANEL, BASIC
BUN/Creatinine ratio: 13 (ref 9–20)
BUN: 10 mg/dL (ref 6–24)
CO2: 28 mmol/L (ref 18–29)
Calcium: 9 mg/dL (ref 8.7–10.2)
Chloride: 101 mmol/L (ref 96–106)
Creatinine: 0.78 mg/dL (ref 0.76–1.27)
GFR est AA: 124 mL/min/{1.73_m2} (ref >59)
GFR est non-AA: 107 mL/min/{1.73_m2} (ref >59)
Glucose: 94 mg/dL (ref 65–99)
Potassium: 4.3 mmol/L (ref 3.5–5.2)
Sodium: 142 mmol/L (ref 134–144)

## 2016-04-06 LAB — LIPID PANEL
Cholesterol, total: 135 mg/dL (ref 100–199)
HDL Cholesterol: 47 mg/dL (ref >39)
LDL, calculated: 69 mg/dL (ref 0–99)
Triglyceride: 93 mg/dL (ref 0–149)
VLDL, calculated: 19 mg/dL (ref 5–40)

## 2016-04-06 LAB — CVD REPORT

## 2016-04-12 ENCOUNTER — Encounter

## 2016-05-22 ENCOUNTER — Ambulatory Visit
Admit: 2016-05-22 | Discharge: 2016-05-22 | Payer: PRIVATE HEALTH INSURANCE | Attending: Pediatric Pulmonology | Primary: Internal Medicine

## 2016-05-22 DIAGNOSIS — G4733 Obstructive sleep apnea (adult) (pediatric): Secondary | ICD-10-CM

## 2016-05-22 NOTE — Progress Notes (Signed)
Per patient request, order faxed to [facility].

## 2016-05-22 NOTE — Progress Notes (Signed)
[address] [place], [place] [address]  Tel.  [phone]  Fax. [phone] [address]  [place], [place] [address]  Tel.  [phone]  Fax. [phone] [address]  [place], [place] [address]  Tel.  [phone]  Fax. [phone]     S>Daryl Morgan is a 46 y.o. male seen for a positive airway pressure follow-up.  He reports  problems using the device.  He is 48.9% compliant over the past 90 days.  The following problems are identified:    Drowsiness no Problems exhaling no   Snoring no Forget to put on no   Mask Comfortable no Can't fall asleep no   Dry Mouth no Mask falls off no   Air Leaking no Frequent awakenings no         He admits that his sleep is improved when CPAP is used. He states that he has not been able to use his PAP Device due to mask discomfort following and anxiety attack in December 2017.    No Known Allergies    He has a current medication list which includes the following prescription(s): melatonin, citalopram, lisinopril-hydrochlorothiazide, amlodipine, and loratadine..      He  has a past medical history of Hypertension; Psychiatric disorder; and Sleep apnea.    Epworth Sleepiness Score: 4   and Modified F.O.S.Q. Score Total / 2: 20   which reflect improved sleep quality over therapy time.    O>    Visit Vitals   ??? BP 128/83   ??? Pulse 75   ??? Ht 6' (1.829 m)   ??? Wt 291 lb (132 kg)   ??? SpO2 96%   ??? BMI 39.47 kg/m2         General:   Not in acute distress   Eyes:  Anicteric sclerae, no obvious strabismus   Nose:  No obvious nasal septum deviation    Oropharynx:   Class 4 oropharyngeal outlet, thick tongue base, uvula not seen due to low-lying soft palate, narrow tonsilo-pharyngeal pilars   Tonsils:   tonsils are not visualized due to low-lying soft palate   Neck:   midline trachea   Chest/Lungs:  Equal lung expansion, clear on auscultation    CVS:  Normal rate, regular rhythm; no JVD   Skin:  Warm to touch; no obvious rashes    Neuro:  No focal deficits ; no obvious tremor    Psych:  Normal affect,  normal countenance;           A>    ICD-10-CM ICD-9-CM    1. OSA on CPAP G47.33 327.23 AMB SUPPLY ORDER    Z99.89 V46.8    2. Essential hypertension I10 401.9    3. H/O anxiety disorder Z86.59 V11.8    4. BMI 38.0-38.9,adult Z68.38 V85.38      AHI = 90 (2017).  On Respironics :  16 - 20 cmH2O.    Compliant:      no    Therapeutic Response:  Positive    P>    * HSAT reviewed SpO2 nadir 61 % subsequent oximetry with SpO2 < 88% : 14 minutes.  Patient currently on high PAP settings ( see media for download ).    * Switch to Bi-Level therapy discussed and advised as was a follow-up oximetry on current settings.  Patient did not want to pursue either of these at this time and wished to get an order for a different mask that he may be able to  tolerate.    * We have recommended a dedicated weight loss through appropriate diet and an exercise regiment as significant weight reduction has been shown to reduce severity of obstructive sleep apnea.       * Follow-up Disposition:  Return in about 1 year (around 05/22/2017), or if symptoms worsen or fail to improve.    * He was asked to contact our office for any problems regarding PAP therapy.    * Counseling was provided regarding the importance of regular PAP use and on proper sleep hygiene and safe driving.    * Re-enforced proper and regular cleaning for the device.    Thank you for allowing [place] to participate in your patient's medical care.          Romana Juniper, MD, FAASM  Electronically signed. 05/22/16

## 2016-05-22 NOTE — Patient Instructions (Signed)
[address]  [place] [address]  Tel.  [phone]  Fax. [phone] [address]  [place] [address]  Tel.  [phone]  Fax. [phone] [address] [place] [address]  Tel.  [phone]  Fax. [phone]     Learning About CPAP for Sleep Apnea  What is CPAP?              CPAP is a small machine that you use at home every night while you sleep. It increases air pressure in your throat to keep your airway open. When you have sleep apnea, this can help you sleep better so you feel much better. CPAP stands for "continuous positive airway pressure."  The CPAP machine will have one of the following:  ?? A mask that covers your nose and mouth  ?? Prongs that fit into your nose  ?? A mask that covers your nose only, the most common type. This type is called NCPAP. The N stands for "nasal."  Why is it done?  CPAP is usually the best treatment for obstructive sleep apnea. It is the first treatment choice and the most widely used. Your doctor may suggest CPAP if you have:  ?? Moderate to severe sleep apnea.  ?? Sleep apnea and coronary artery disease (CAD) or heart failure.  How does it help?  ?? CPAP can help you have more normal sleep, so you feel less sleepy and more alert during the daytime.  ?? CPAP may help keep heart failure or other heart problems from getting worse.  ?? NCPAP may help lower your blood pressure.  ?? If you use CPAP, your bed partner may also sleep better because you are not snoring or restless.  What are the side effects?  Some people who use CPAP have:  ?? A dry or stuffy nose and a sore throat.  ?? Irritated skin on the face.  ?? Sore eyes.  ?? Bloating.  If you have any of these problems, work with your doctor to fix them. Here are some things you can try:  ?? Be sure the mask or nasal prongs fit well.  ?? See if your doctor can adjust the pressure of your CPAP.  ?? If your nose is dry, try a humidifier.   ?? If your nose is runny or stuffy, try decongestant medicine or a steroid nasal spray.  If these things do not help, you might try a different type of machine. Some machines have air pressure that adjusts on its own. Others have air pressures that are different when you breathe in than when you breathe out. This may reduce discomfort caused by too much pressure in your nose.               Where can you learn more?   Go to http://www.healthwise.net/[facility]  Enter X266 in the search box to learn more about "Learning About CPAP for Sleep Apnea."   ?? 2006-2011 Healthwise, Incorporated. Care instructions adapted under license by [facility] (which disclaims liability or warranty for this information). This care instruction is for use with your licensed healthcare professional. If you have questions about a medical condition or this instruction, always ask your healthcare professional. Healthwise, Incorporated disclaims any warranty or liability for your use of this information.  Content Version: 8.9.83828; Last Revised: February 03, 2008  PROPER SLEEP HYGIENE    What to avoid  ?? Do not have drinks with caffeine, such as coffee or black tea,   for 8 hours before bed.  ?? Do not smoke or use other types of tobacco near bedtime. Nicotine is a stimulant and can keep you awake.  ?? Avoid drinking alcohol late in the evening, because it can cause you to wake in the middle of the night.  ?? Do not eat a big meal close to bedtime. If you are hungry, eat a light snack.  ?? Do not drink a lot of water close to bedtime, because the need to urinate may wake you up during the night.  ?? Do not read or watch TV in bed. Use the bed only for sleeping and sexual activity.  What to try  ?? Go to bed at the same time every night, and wake up at the same time every morning. Do not take naps during the day.  ?? Keep your bedroom quiet, dark, and cool.  ?? Get regular exercise, but not within 3 to 4 hours of your bedtime..   ?? Sleep on a comfortable pillow and mattress.  ?? If watching the clock makes you anxious, turn it facing away from you so you cannot see the time.  ?? If you worry when you lie down, start a worry book. Well before bedtime, write down your worries, and then set the book and your concerns aside.  ?? Try meditation or other relaxation techniques before you go to bed.  ?? If you cannot fall asleep, get up and go to another room until you feel sleepy. Do something relaxing. Repeat your bedtime routine before you go to bed again.  ?? Make your house quiet and calm about an hour before bedtime. Turn down the lights, turn off the TV, log off the computer, and turn down the volume on music. This can help you relax after a busy day.    Drowsy Driving: The U.S[place] National Highway Traffic Safety Administration cites drowsiness as a causing factor in more than 100,000 police reported crashes annually, resulting in 76,000 injuries and 1,500 deaths. Other surveys suggest 55% of people polled have driven while drowsy in the past year, 23% had fallen asleep but not crashed, 3% crashed, and 2% had and accident due to drowsy driving.  Who is at risk?   Young Drivers: One study of drowsy driving accidents states that 55% of the drivers were under 25 years. Of those, 75% were male.   Shift Workers and Travelers: People who work overnight or travel across time zones frequently are at higher risk of experiencing Circadian Rhythm Disorders. They are trying to work and function when their body is programed to sleep.   Sleep Deprived: Lack of sleep has a serious impact on your ability to pay attention or focus on a task. Consistently getting less than the average of 8 hours your body needs creates partial or cumulative sleep deprivation.   Untreated Sleep Disorders: Sleep Apnea, Narcolepsy, R.L.S., and other sleep disorders (untreated) prevent a person from getting enough restful  sleep. This leads to excessive daytime sleepiness and increases the risk for drowsy driving accidents by up to 7 times.  Medications / Alcohol: Even over the counter medications can cause drowsiness. Medications that impair a drivers attention should have a warning label. Alcohol naturally makes you sleepy and on its own can cause accidents. Combined with excessive drowsiness its effects are amplified.   Signs of Drowsy Driving:   * You don't remember driving the last few miles   * You may drift out of your lane   *   You are unable to focus and your thoughts wander   * You may yawn more often than normal   * You have difficulty keeping your eyes open / nodding off   * Missing traffic signs, speeding, or tailgating  Prevention-   Good sleep hygiene, lifestyle and behavioral choices have the most impact on drowsy driving. There is no substitute for sleep and the average person requires 8 hours nightly. If you find yourself driving drowsy, stop and sleep. Consider the sleep hygiene tips provided during your visit as well.     Medication Refill Policy: Refills for all medications require 1 week advance notice. Please have your pharmacy fax a refill request. We are unable to fax, or call in "controled substance" medications and you will need to pick these prescriptions up from our office.     MyChart Activation    Thank you for requesting access to MyChart. Please follow the instructions below to securely access and download your online medical record. MyChart allows you to send messages to your doctor, view your test results, renew your prescriptions, schedule appointments, and more.    How Do I Sign Up?    1. In your internet browser, go to [email or web address].  2. Click on the First Time User? Click Here link in the Sign In box. You will see the New Member Sign Up page.  3. Enter your MyChart Access Code exactly as it appears below. You will  not need to use this code after you???ve completed the sign-up process. If you do not sign up before the expiration date, you must request a new code.    MyChart Access Code: Activation code not generated  Current MyChart Status: Active (This is the date your MyChart access code will expire)    4. Enter the last four digits of your Social Security Number (xxxx) and Date of Birth (mm/dd/yyyy) as indicated and click Submit. You will be taken to the next sign-up page.  5. Create a MyChart ID. This will be your MyChart login ID and cannot be changed, so think of one that is secure and easy to remember.  6. Create a MyChart password. You can change your password at any time.  7. Enter your Password Reset Question and Answer. This can be used at a later time if you forget your password.   8. Enter your e-mail address. You will receive e-mail notification when new information is available in MyChart.  9. Click Sign Up. You can now view and download portions of your medical record.  10. Click the Download Summary menu link to download a portable copy of your medical information.    Additional Information    If you have questions, please call [phone]. Remember, MyChart is NOT to be used for urgent needs. For medical emergencies, dial 911.

## 2016-05-22 NOTE — Progress Notes (Signed)
Patient called back and would like to use [facility] as new DME. Please forward all order to [facility]

## 2016-05-22 NOTE — Progress Notes (Signed)
Patient was not happy with [facility] and wanted to change DME.  He took information of [facility] and [facility].  He said he will research more about the company and will [place] to let us know where to send the order to.

## 2016-06-05 ENCOUNTER — Encounter

## 2016-06-05 MED ORDER — CITALOPRAM 20 MG TAB
20 mg | ORAL_TABLET | ORAL | 1 refills | Status: DC
Start: 2016-06-05 — End: 2017-01-02

## 2016-06-05 MED ORDER — AMLODIPINE 10 MG TAB
10 mg | ORAL_TABLET | ORAL | 1 refills | Status: DC
Start: 2016-06-05 — End: 2017-01-02

## 2016-06-05 MED ORDER — LISINOPRIL-HYDROCHLOROTHIAZIDE 20 MG-12.5 MG TAB
ORAL_TABLET | ORAL | 1 refills | Status: DC
Start: 2016-06-05 — End: 2017-01-02

## 2017-01-02 ENCOUNTER — Encounter

## 2017-01-02 MED ORDER — AMLODIPINE 10 MG TAB
10 mg | ORAL_TABLET | ORAL | 1 refills | Status: AC
Start: 2017-01-02 — End: ?

## 2017-01-02 MED ORDER — CITALOPRAM 20 MG TAB
20 mg | ORAL_TABLET | ORAL | 1 refills | Status: AC
Start: 2017-01-02 — End: ?

## 2017-01-02 MED ORDER — LISINOPRIL-HYDROCHLOROTHIAZIDE 20 MG-12.5 MG TAB
ORAL_TABLET | ORAL | 1 refills | Status: AC
Start: 2017-01-02 — End: ?

## 2017-05-17 NOTE — Progress Notes (Signed)
Patient left voicemail regarding cpap device, lvm for patient to call our office

## 2017-05-21 ENCOUNTER — Ambulatory Visit
Admit: 2017-05-21 | Discharge: 2017-05-21 | Payer: PRIVATE HEALTH INSURANCE | Attending: Pediatric Pulmonology | Primary: Internal Medicine

## 2017-05-21 DIAGNOSIS — G4733 Obstructive sleep apnea (adult) (pediatric): Secondary | ICD-10-CM

## 2017-05-21 NOTE — Patient Instructions (Signed)
[address]  [place] [address]  Tel.  [phone]  Fax. [phone] [address]  [place] [address]  Tel.  [phone]  Fax. [phone] [address] [place] [address]  Tel.  [phone]  Fax. [phone]     Learning About CPAP for Sleep Apnea  What is CPAP?              CPAP is a small machine that you use at home every night while you sleep. It increases air pressure in your throat to keep your airway open. When you have sleep apnea, this can help you sleep better so you feel much better. CPAP stands for "continuous positive airway pressure."  The CPAP machine will have one of the following:  ?? A mask that covers your nose and mouth  ?? Prongs that fit into your nose  ?? A mask that covers your nose only, the most common type. This type is called NCPAP. The N stands for "nasal."  Why is it done?  CPAP is usually the best treatment for obstructive sleep apnea. It is the first treatment choice and the most widely used. Your doctor may suggest CPAP if you have:  ?? Moderate to severe sleep apnea.  ?? Sleep apnea and coronary artery disease (CAD) or heart failure.  How does it help?  ?? CPAP can help you have more normal sleep, so you feel less sleepy and more alert during the daytime.  ?? CPAP may help keep heart failure or other heart problems from getting worse.  ?? NCPAP may help lower your blood pressure.  ?? If you use CPAP, your bed partner may also sleep better because you are not snoring or restless.  What are the side effects?  Some people who use CPAP have:  ?? A dry or stuffy nose and a sore throat.  ?? Irritated skin on the face.  ?? Sore eyes.  ?? Bloating.  If you have any of these problems, work with your doctor to fix them. Here are some things you can try:  ?? Be sure the mask or nasal prongs fit well.  ?? See if your doctor can adjust the pressure of your CPAP.  ?? If your nose is dry, try a humidifier.   ?? If your nose is runny or stuffy, try decongestant medicine or a steroid nasal spray.  If these things do not help, you might try a different type of machine. Some machines have air pressure that adjusts on its own. Others have air pressures that are different when you breathe in than when you breathe out. This may reduce discomfort caused by too much pressure in your nose.               Where can you learn more?   Go to http://www.healthwise.net/[facility]  Enter X266 in the search box to learn more about "Learning About CPAP for Sleep Apnea."   ?? 2006-2011 Healthwise, Incorporated. Care instructions adapted under license by [facility] (which disclaims liability or warranty for this information). This care instruction is for use with your licensed healthcare professional. If you have questions about a medical condition or this instruction, always ask your healthcare professional. Healthwise, Incorporated disclaims any warranty or liability for your use of this information.  Content Version: 8.9.83828; Last Revised: February 03, 2008  PROPER SLEEP HYGIENE    What to avoid  ?? Do not have drinks with caffeine, such as coffee or black tea,   for 8 hours before bed.  ?? Do not smoke or use other types of tobacco near bedtime. Nicotine is a stimulant and can keep you awake.  ?? Avoid drinking alcohol late in the evening, because it can cause you to wake in the middle of the night.  ?? Do not eat a big meal close to bedtime. If you are hungry, eat a light snack.  ?? Do not drink a lot of water close to bedtime, because the need to urinate may wake you up during the night.  ?? Do not read or watch TV in bed. Use the bed only for sleeping and sexual activity.  What to try  ?? Go to bed at the same time every night, and wake up at the same time every morning. Do not take naps during the day.  ?? Keep your bedroom quiet, dark, and cool.  ?? Get regular exercise, but not within 3 to 4 hours of your bedtime..   ?? Sleep on a comfortable pillow and mattress.  ?? If watching the clock makes you anxious, turn it facing away from you so you cannot see the time.  ?? If you worry when you lie down, start a worry book. Well before bedtime, write down your worries, and then set the book and your concerns aside.  ?? Try meditation or other relaxation techniques before you go to bed.  ?? If you cannot fall asleep, get up and go to another room until you feel sleepy. Do something relaxing. Repeat your bedtime routine before you go to bed again.  ?? Make your house quiet and calm about an hour before bedtime. Turn down the lights, turn off the TV, log off the computer, and turn down the volume on music. This can help you relax after a busy day.    Drowsy Driving: The [place].S[place] National Highway Traffic Safety Administration cites drowsiness as a causing factor in more than 100,000 police reported crashes annually, resulting in 76,000 injuries and 1,500 deaths. Other surveys suggest 55% of people polled have driven while drowsy in the past year, 23% had fallen asleep but not crashed, 3% crashed, and 2% had and accident due to drowsy driving.  Who is at risk?   Young Drivers: One study of drowsy driving accidents states that 55% of the drivers were under 25 years. Of those, 75% were male.   Shift Workers and Travelers: People who work overnight or travel across time zones frequently are at higher risk of experiencing Circadian Rhythm Disorders. They are trying to work and function when their body is programed to sleep.   Sleep Deprived: Lack of sleep has a serious impact on your ability to pay attention or focus on a task. Consistently getting less than the average of 8 hours your body needs creates partial or cumulative sleep deprivation.   Untreated Sleep Disorders: Sleep Apnea, Narcolepsy, R.L.S., and other sleep disorders (untreated) prevent a person from getting enough restful  sleep. This leads to excessive daytime sleepiness and increases the risk for drowsy driving accidents by up to 7 times.  Medications / Alcohol: Even over the counter medications can cause drowsiness. Medications that impair a drivers attention should have a warning label. Alcohol naturally makes you sleepy and on its own can cause accidents. Combined with excessive drowsiness its effects are amplified.   Signs of Drowsy Driving:   * You don't remember driving the last few miles   * You may drift out of your lane   *   You are unable to focus and your thoughts wander   * You may yawn more often than normal   * You have difficulty keeping your eyes open / nodding off   * Missing traffic signs, speeding, or tailgating  Prevention-   Good sleep hygiene, lifestyle and behavioral choices have the most impact on drowsy driving. There is no substitute for sleep and the average person requires 8 hours nightly. If you find yourself driving drowsy, stop and sleep. Consider the sleep hygiene tips provided during your visit as well.     Medication Refill Policy: Refills for all medications require 1 week advance notice. Please have your pharmacy fax a refill request. We are unable to fax, or call in "controled substance" medications and you will need to pick these prescriptions up from our office.     MyChart Activation    Thank you for requesting access to MyChart. Please follow the instructions below to securely access and download your online medical record. MyChart allows you to send messages to your doctor, view your test results, renew your prescriptions, schedule appointments, and more.    How Do I Sign Up?    1. In your internet browser, go to [email or web address].  2. Click on the First Time User? Click Here link in the Sign In box. You will see the New Member Sign Up page.  3. Enter your MyChart Access Code exactly as it appears below. You will  not need to use this code after you???ve completed the sign-up process. If you do not sign up before the expiration date, you must request a new code.    MyChart Access Code: Activation code not generated  Current MyChart Status: Active (This is the date your MyChart access code will expire)    4. Enter the last four digits of your Social Security Number (xxxx) and Date of Birth (mm/dd/yyyy) as indicated and click Submit. You will be taken to the next sign-up page.  5. Create a MyChart ID. This will be your MyChart login ID and cannot be changed, so think of one that is secure and easy to remember.  6. Create a MyChart password. You can change your password at any time.  7. Enter your Password Reset Question and Answer. This can be used at a later time if you forget your password.   8. Enter your e-mail address. You will receive e-mail notification when new information is available in MyChart.  9. Click Sign Up. You can now view and download portions of your medical record.  10. Click the Download Summary menu link to download a portable copy of your medical information.    Additional Information    If you have questions, please call [phone]. Remember, MyChart is NOT to be used for urgent needs. For medical emergencies, dial 911.

## 2017-05-21 NOTE — Progress Notes (Signed)
Supplies order faxed.

## 2017-05-21 NOTE — Progress Notes (Signed)
[address] [place], [place] [address]  Tel.  [phone]  Fax. [phone] [address]  [place], [place] [address]  Tel.  [phone]  Fax. [phone] [address]  [place], [place] [address]  Tel.  [phone]  Fax. [phone]     S>Daryl Morgan is a 47 y.o. male seen for a positive airway pressure follow-up.  He reports no problems using the device.  He is 90% compliant over the past 30 days.  The following problems are identified:    Drowsiness no Problems exhaling no   Snoring no Forget to put on no   Mask Comfortable yes Can't fall asleep no   Dry Mouth no Mask falls off no   Air Leaking no Frequent awakenings no         He admits that his sleep has improved on PAP therapy using FF mask and regular tubing.    No Known Allergies    He has a current medication list which includes the following prescription(s): amlodipine, lisinopril-hydrochlorothiazide, citalopram, melatonin, and loratadine..      He  has a past medical history of Hypertension, Psychiatric disorder, and Sleep apnea.    Epworth Sleepiness Score: 3   and Modified F.O.S.Q. Score Total / 2: 19.5   which reflect improved sleep quality over therapy time.    O>    Visit Vitals  BP 131/86   Pulse 67   Ht 6' (1.829 m)   Wt 301 lb (136.5 kg)   SpO2 97%   BMI 40.82 kg/m??         General:   Not in acute distress   Eyes:  Anicteric sclerae, no obvious strabismus   Nose:  No obvious nasal septum deviation    Oropharynx:   Class 4 oropharyngeal outlet, thick tongue base, uvula not seen due to prior UPPP, ow-lying soft palate, narrow tonsilo-pharyngeal pilars   Tonsils:   tonsils are not visualized due to low-lying soft palate   Neck:   midline trachea   Chest/Lungs:  Equal lung expansion, clear on auscultation    CVS:  Normal rate, regular rhythm; no JVD   Skin:  Warm to touch; no obvious rashes   Neuro:  No focal deficits ; no obvious tremor    Psych:  Normal affect,  normal countenance;           A>     ICD-10-CM ICD-9-CM    1. OSA (obstructive sleep apnea) G47.33 327.23 AMB SUPPLY ORDER   2. S/P UPPP (uvulopalatopharyngoplasty) Z98.890 V45.89    3. BMI 40.0-44.9, adult (HCC) Z68.41 V85.41    4. Essential hypertension I10 401.9    5. H/O anxiety disorder Z86.59 V11.8      AHI = 90 (2017).  On Respironics :  APAP 14-18 cmH2O.    Compliant:      yes    Therapeutic Response:  Positive    P>    Orders Placed This Encounter   ??? AMB SUPPLY ORDER     .Diagnosis: (G47.33) OSA (obstructive sleep apnea)  (primary encounter diagnosis)    Replacement Supplies for Positive Airway Pressure Therapy Device:   Duration of need: 99 months.     E3329 Full Face Mask 1 every 3 months.  J1884 Full Face Mask Cushion 1 per month.    Z6606 Headgear 1 every 6 months.    T0160 Tubing 1 every 3 months.    F0932 Filter(s) Disposable 2 per month.  T5573 Filter(s) Non-Disposable 1 every  6 months.   .   Z6109 Water Chamber for Humidifier (Replace) 1 every 6 months.      Romana Juniper, MD, FAASM; NPI: [id number]    Electronically signed. Date:- 05/21/17     * Blood pressure has been under good control and his mood has been stable.    * We have recommended a dedicated weight loss through appropriate diet and an exercise regiment as significant weight reduction has been shown to reduce severity of obstructive sleep apnea.       *   Follow-up and Dispositions    ?? Return in about 1 year (around 05/22/2018), or if symptoms worsen or fail to improve.         * He was asked to contact our office for any problems regarding PAP therapy.    * Counseling was provided regarding the importance of regular PAP use and on proper sleep hygiene and safe driving.    * Re-enforced proper and regular cleaning for the device.    Thank you for allowing [place] to participate in your patient's medical care.      Romana Juniper, MD, FAASM  Electronically signed. 05/21/17

## 2019-04-29 ENCOUNTER — Telehealth: Attending: Adult Health | Primary: Internal Medicine

## 2019-04-29 ENCOUNTER — Telehealth
Admit: 2019-04-29 | Discharge: 2019-04-29 | Payer: PRIVATE HEALTH INSURANCE | Attending: Adult Health | Primary: Internal Medicine

## 2019-04-29 DIAGNOSIS — G4733 Obstructive sleep apnea (adult) (pediatric): Secondary | ICD-10-CM

## 2019-04-29 NOTE — Patient Instructions (Signed)
[address]  [place] [address]  Tel.  [phone]  Fax. [phone] [address]  [place] [address]  Tel.  [phone]  Fax. [phone] [address] [place] [address]  Tel.  [phone]  Fax. [phone]     Learning About CPAP for Sleep Apnea  What is CPAP?              CPAP is a small machine that you use at home every night while you sleep. It increases air pressure in your throat to keep your airway open. When you have sleep apnea, this can help you sleep better so you feel much better. CPAP stands for "continuous positive airway pressure."  The CPAP machine will have one of the following:  ?? A mask that covers your nose and mouth  ?? Prongs that fit into your nose  ?? A mask that covers your nose only, the most common type. This type is called NCPAP. The N stands for "nasal."  Why is it done?  CPAP is usually the best treatment for obstructive sleep apnea. It is the first treatment choice and the most widely used. Your doctor may suggest CPAP if you have:  ?? Moderate to severe sleep apnea.  ?? Sleep apnea and coronary artery disease (CAD) or heart failure.  How does it help?  ?? CPAP can help you have more normal sleep, so you feel less sleepy and more alert during the daytime.  ?? CPAP may help keep heart failure or other heart problems from getting worse.  ?? NCPAP may help lower your blood pressure.  ?? If you use CPAP, your bed partner may also sleep better because you are not snoring or restless.  What are the side effects?  Some people who use CPAP have:  ?? A dry or stuffy nose and a sore throat.  ?? Irritated skin on the face.  ?? Sore eyes.  ?? Bloating.  If you have any of these problems, work with your doctor to fix them. Here are some things you can try:  ?? Be sure the mask or nasal prongs fit well.  ?? See if your doctor can adjust the pressure of your CPAP.  ?? If your nose is dry, try a humidifier.  ?? If your nose is runny or stuffy, try  decongestant medicine or a steroid nasal spray.  If these things do not help, you might try a different type of machine. Some machines have air pressure that adjusts on its own. Others have air pressures that are different when you breathe in than when you breathe out. This may reduce discomfort caused by too much pressure in your nose.               Where can you learn more?   Go to http://www.healthwise.net/[facility]  Enter X266 in the search box to learn more about "Learning About CPAP for Sleep Apnea."   ?? 2006-2011 Healthwise, Inc[employer]. Care instructions adapted under license by [facility] (which disclaims liability or warranty for this information). This care instruction is for use with your licensed healthcare professional. If you have questions about a medical condition or this instruction, always ask your healthcare professional. Healthwise, Incorporated disclaims any warranty or liability for your use of this information.  Content Version: 8.9.83828; Last Revised: February 03, 2008  PROPER SLEEP HYGIENE    What to avoid  ?? Do not have drinks with caffeine, such as coffee or black tea,   for 8 hours before bed.  ?? Do not smoke or use other types of tobacco near bedtime. Nicotine is a stimulant and can keep you awake.  ?? Avoid drinking alcohol late in the evening, because it can cause you to wake in the middle of the night.  ?? Do not eat a big meal close to bedtime. If you are hungry, eat a light snack.  ?? Do not drink a lot of water close to bedtime, because the need to urinate may wake you up during the night.  ?? Do not read or watch TV in bed. Use the bed only for sleeping and sexual activity.  What to try  ?? Go to bed at the same time every night, and wake up at the same time every morning. Do not take naps during the day.  ?? Keep your bedroom quiet, dark, and cool.  ?? Get regular exercise, but not within 3 to 4 hours of your bedtime..  ?? Sleep on a comfortable pillow and mattress.  ?? If watching the  clock makes you anxious, turn it facing away from you so you cannot see the time.  ?? If you worry when you lie down, start a worry book. Well before bedtime, write down your worries, and then set the book and your concerns aside.  ?? Try meditation or other relaxation techniques before you go to bed.  ?? If you cannot fall asleep, get up and go to another room until you feel sleepy. Do something relaxing. Repeat your bedtime routine before you go to bed again.  ?? Make your house quiet and calm about an hour before bedtime. Turn down the lights, turn off the TV, log off the computer, and turn down the volume on music. This can help you relax after a busy day.    Drowsy Driving: The [place] National Highway Traffic Safety Administration cites drowsiness as a causing factor in more than 100,000 police reported crashes annually, resulting in 76,000 injuries and 1,500 deaths. Other surveys suggest 55% of people polled have driven while drowsy in the past year, 23% had fallen asleep but not crashed, 3% crashed, and 2% had and accident due to drowsy driving.  Who is at risk?   Young Drivers: One study of drowsy driving accidents states that 55% of the drivers were under 25 years. Of those, 75% were male.   Shift Workers and Travelers: People who work overnight or travel across time zones frequently are at higher risk of experiencing Circadian Rhythm Disorders. They are trying to work and function when their body is programed to sleep.   Sleep Deprived: Lack of sleep has a serious impact on your ability to pay attention or focus on a task. Consistently getting less than the average of 8 hours your body needs creates partial or cumulative sleep deprivation.   Untreated Sleep Disorders: Sleep Apnea, Narcolepsy, R.L.S., and other sleep disorders (untreated) prevent a person from getting enough restful sleep. This leads to excessive daytime sleepiness and increases the risk for drowsy driving accidents by up to 7 times.  Medications  / Alcohol: Even over the counter medications can cause drowsiness. Medications that impair a drivers attention should have a warning label. Alcohol naturally makes you sleepy and on its own can cause accidents. Combined with excessive drowsiness its effects are amplified.   Signs of Drowsy Driving:   * You don't remember driving the last few miles   * You may drift out of your lane   *   You are unable to focus and your thoughts wander   * You may yawn more often than normal   * You have difficulty keeping your eyes open / nodding off   * Missing traffic signs, speeding, or tailgating  Prevention-   Good sleep hygiene, lifestyle and behavioral choices have the most impact on drowsy driving. There is no substitute for sleep and the average person requires 8 hours nightly. If you find yourself driving drowsy, stop and sleep. Consider the sleep hygiene tips provided during your visit as well.     Medication Refill Policy: Refills for all medications require 1 week advance notice. Please have your pharmacy fax a refill request. We are unable to fax, or call in "controled substance" medications and you will need to pick these prescriptions up from our office.     MyChart Activation    Thank you for requesting access to MyChart. Please follow the instructions below to securely access and download your online medical record. MyChart allows you to send messages to your doctor, view your test results, renew your prescriptions, schedule appointments, and more.    How Do I Sign Up?    1. In your internet browser, go to [email or web address].  2. Click on the First Time User? Click Here link in the Sign In box. You will see the New Member Sign Up page.  3. Enter your MyChart Access Code exactly as it appears below. You will not need to use this code after you???ve completed the sign-up process. If you do not sign up before the expiration date, you must request a new code.    MyChart Access Code: Activation code  not generated  Current MyChart Status: Active (This is the date your MyChart access code will expire)    4. Enter the last four digits of your Social Security Number (xxxx) and Date of Birth (mm/dd/yyyy) as indicated and click Submit. You will be taken to the next sign-up page.  5. Create a MyChart ID. This will be your MyChart login ID and cannot be changed, so think of one that is secure and easy to remember.  6. Create a MyChart password. You can change your password at any time.  7. Enter your Password Reset Question and Answer. This can be used at a later time if you forget your password.   8. Enter your e-mail address. You will receive e-mail notification when new information is available in MyChart.  9. Click Sign Up. You can now view and download portions of your medical record.  10. Click the Download Summary menu link to download a portable copy of your medical information.    Additional Information    If you have questions, please call [phone]. Remember, MyChart is NOT to be used for urgent needs. For medical emergencies, dial 911.

## 2019-04-29 NOTE — Progress Notes (Signed)
Supplies order faxed.

## 2019-04-29 NOTE — Progress Notes (Signed)
[address] [place], [place] [address]   Tel.  [phone]   Fax. [phone]  [address]   [place], [place] [address]   Tel.  [phone]   Fax. [phone]  [address]   [place], [place] [address]   Tel.  [phone]   Fax. [phone]     Daryl Morgan (DOB: 1970-08-10) is a 49 y.o. male, established patient, seen for positive airway pressure follow-up, he was last seen by Dr. Allena Napoleon on 05/21/2017, prior notes reviewed in detail.  Home sleep test 01/2015 showed AHI of 90.4/hr with a lowest SpO2 of 61%, duration of SpO2 < 88% 335 min.     ASSESSMENT/PLAN:    ICD-10-CM ICD-9-CM    1. OSA (obstructive sleep apnea)  G47.33 327.23 AMB SUPPLY ORDER   2. Essential hypertension  I10 401.9    3. BMI 40.0-44.9, adult (HCC)  Z68.41 V85.41        AHI = 90.4(01/2015).  On Respironics APAP :  14-18 cmH2O. Set up 2/7/20217.    He is adherent with PAP therapy and PAP continues to benefit patient and remains necessary for control of his sleep apnea.     Follow-up and Dispositions    ?? Return in about 1 year (around 04/28/2020).         1. Sleep Apnea -   Continue on current pressures    * Supplies ordered - full face mask and heated tubing    Orders Placed This Encounter   ??? AMB SUPPLY ORDER     Diagnosis: (G47.33) OSA (obstructive sleep apnea)  (primary encounter diagnosis)     Replacement Supplies for Positive Airway Pressure Therapy Device:   Duration of need: 99 months.       P8242 Full Face Mask 1 every 3 months.  P5361 Full Face Mask Cushion 1 per month.    W4315 Headgear 1 every 6 months.  Q0086 Pos Airway pressure chin strap    A4604 Tubing with heating element 1 every 3 months.    P6195 Filter(s) Disposable 2 per month.  K9326 Filter(s) Non-Disposable 1 every 6 months.   .   Z1245 Water Chamber for Humidifier (Replace) 1 every 6 months.      Cherre Huger, AGPCNP-BC; NPI: [id number]    Electronically signed. Date:- 04/29/19       * Re-enforced proper and regular cleaning for  the device.    * He was asked to contact our office for any problems regarding PAP therapy.    2. Hypertension -  continue on his current regimen, he will continue to monitor his BP and follow up with his PMD for reevaluation/adjustment of medications if warranted.  I have reviewed the relationship between hypertension as it relates to sleep-disordered breathing.    3. Encouraged continued weight management program through appropriate diet and exercise regimen as significant weight reduction has been shown to reduce severity of obstructive sleep apnea. He walks around his neighborhood.     SUBJECTIVE/OBJECTIVE:    He  is seen today for follow up on PAP device and reports no problems using the device.   The following concerns identified:    Drowsiness no Problems exhaling no   Snoring no Forget to put on no   Mask Comfortable yes Can't fall asleep no   Dry Mouth no Mask falls off no   Air Leaking no Frequent awakenings no       He admits that his sleep has improved on  PAP therapy using full face mask and heated tubing. He notes no problems other than needing supplies.    Review of device download indicated:  Auto pressure: 14-18 cmH2O; Peak Avg Pressure: 15.6 cmH2O;  Avg. Device Pressure <= 90 %: 16.0 cmH2O    Average % Night in Large Leak:  8.7  % Used Days >= 4 hours: 100.  Avg hours used:  9:22.    Therapy Apnea Index averaged over PAP use: 2.3 /hr which reflects improved sleep breathing condition.    Epworth Sleepiness Score: 9 and Modified F.O.S.Q. Score Total / 2: 20     Sleep Review of Systems: notable for Negative frequent awakenings at night; Negative early morning headaches; Negative memory problems; Negative concentration issues; Negative chest pain; Negative shortness of breath; Negative significant joint pain at night; Negative significant muscle pain at night; Negative rashes or itching;; Negative significant mood issues;     Vitals reported by patient   Patient-Reported Vitals 04/29/2019    Patient-Reported Weight 298 lbs      Calculated BMI 40    Physical Exam completed by visual and auditory observation of patient with verbal input from patient.    General:   Alert, oriented, not in acute distress   Eyes:  Anicteric Sclerae; no obvious strabismus   Nose:  No obvious nasal septum deviation    Neck:   Midline trachea, no visible mass   Chest/Lungs:  Respiratory effort normal, no visualized signs of difficulty breathing or respiratory distress   CVS:  No JVD   Extremities:  No obvious rashes noted on face, neck, or hands   Neuro:  No facial asymmetry, no focal deficits; no obvious tremor    Psych:  Normal affect,  normal countenance     Daryl Morgan is being evaluated by a Virtual Visit (video visit) encounter to address concerns as mentioned above.  A caregiver was present when appropriate. Due to this being a Scientist, physiological (During NIOEV-03 public health emergency), evaluation of the following organ systems was limited: Vitals/Constitutional/EENT/Resp/CV/GI/GU/MS/Neuro/Skin/Heme-Lymph-Imm.  Pursuant to the emergency declaration under the [place] waiver authority and the [employer] Act, this Virtual Visit was conducted with patient's (and/or legal guardian's) consent, to reduce the patient's risk of exposure to COVID-19 and provide necessary medical care.      Patient identification was verified at the start of the visit: YES using name and date of birth. Patient's phone number [phone] (cell) was confirmed for accuracy.  He gives permission for messages regarding results and appointments to be left at that number.    Services were provided through a video synchronous discussion virtually to substitute for in-person clinic visit.  I was in the office while conducting this encounter, patient located at their home or alternate location of their choice.    An electronic signature was used to  authenticate this note.    -- Carter Kitten, NP, [facility]  04/29/19

## 2019-04-29 NOTE — Progress Notes (Signed)
Progress Notes by Carter Kitten, NP at 04/29/19 1350                Author: Carter Kitten, NP  Service: --  Author Type: Nurse Practitioner       Filed: 04/29/19 1419  Encounter Date: 04/29/2019  Status: Signed          Editor: Carter Kitten, NP (Nurse Practitioner)                                          [address] [place] [address]    Tel.  [phone]    F[place] [address]    Tel.  [phone]    Fax. [phone]   [place] [address]    Tel.  [phone]    Fax. Argyle (DOB: Apr 08, 1970) is  a 49 y.o. male, established patient,  seen for positive airway pressure follow-up, he was last seen by Dr. Bretta Bang on 05/21/2017, prior notes reviewed in detail.  Home sleep test 01/2015  showed AHI of 90.4/hr with a lowest SpO2 of 61%, duration of SpO2 < 88% 335 min.       ASSESSMENT/PLAN:             ICD-10-CM  ICD-9-CM             1.  OSA (obstructive sleep apnea)   G47.33  327.23  AMB SUPPLY ORDER     2.  Essential hypertension   I10  401.9             3.  BMI 40.0-44.9, adult (HCC)   Z68.41  V85.41             AHI = 90.4(01/2015).  On Respironics APAP :  14-18 cmH2O. Set up 2/7/20217.      He is adherent with PAP therapy and PAP continues to benefit patient and remains necessary for control of  his sleep apnea.         Follow-up and Dispositions      ??  Return in about 1 year (around 04/28/2020).                1.  Sleep Apnea -   Continue on current pressures      * Supplies ordered - full face mask and heated tubing        Orders Placed This Encounter        ?  AMB SUPPLY ORDER             Diagnosis: (G47.33) OSA (obstructive sleep apnea)  (primary encounter diagnosis)       Replacement Supplies for Positive Airway Pressure Therapy Device:    Duration of need: 99 months.          T0240 Full Face Mask 1 every 3 months.   X7353 Full Face Mask Cushion 1 per month.      G9924  Headgear 1 every 6 months.   Q6834 Pos Airway pressure chin strap      A4604 Tubing with heating element 1 every 3 months.      H9622 Filter(s) Disposable 2 per month.   W9798 Filter(s) Non-Disposable 1 every 6 months.    Marland Kitchen  G5498 Water Chamber for [employer] (Replace) 1 every 6 months.         Cherre Huger, AGPCNP-BC; NPI: [id number]      Electronically signed. Date:- 04/29/19           * Re-enforced proper and regular cleaning for the device.      * He was asked to contact our office for any problems regarding PAP therapy.      2. Hypertension -  continue on his current regimen,  he will continue to monitor his BP and follow up with  his PMD for reevaluation/adjustment of medications if warranted.  I have reviewed the relationship between hypertension as it relates to sleep-disordered breathing.      3. Encouraged continued weight management program through appropriate diet and exercise regimen as significant weight reduction has been shown to reduce severity of obstructive sleep apnea. He walks around his neighborhood.       SUBJECTIVE/OBJECTIVE:      He  is seen today for follow up on PAP device and reports no problems using the device.   The following concerns identified:           Drowsiness  no  Problems exhaling  no     Snoring  no  Forget to put on  no     Mask Comfortable  yes  Can't fall asleep  no     Dry Mouth  no  Mask falls off  no          Air Leaking  no  Frequent awakenings  no           He admits that his sleep has  improved on PAP therapy using full face mask and heated tubing. He notes no problems other than needing supplies.      Review of device download indicated:   Auto pressure: 14-18 cmH2O; Peak Avg Pressure: 15.6 cmH2O;  Avg. Device Pressure <= 90 %: 16.0 cmH2O     Average % Night in Large Leak:  8.7   % Used Days >= 4 hours: 100.   Avg hours used:  9:22.      Therapy Apnea Index averaged over PAP use: 2.3 /hr which reflects improved sleep breathing condition.      Epworth  Sleepiness Score: 9 and Modified F.O.S.Q. Score Total / 2: 20        Sleep Review of Systems: notable for Negative frequent awakenings at night; Negative early morning headaches; Negative memory problems; Negative  concentration issues; Negative chest pain; Negative shortness of breath; Negative significant joint pain at night; Negative significant muscle pain at night; Negative rashes or itching;; Negative significant mood issues;       Vitals reported by patient       Patient-Reported Vitals  04/29/2019        Patient-Reported Weight  298 lbs         Calculated BMI 40      Physical Exam completed by visual and auditory observation of patient with verbal input from patient.         General:    Alert, oriented, not in acute distress        Eyes:   Anicteric Sclerae; no obvious strabismus     Nose:   No obvious nasal septum deviation      Neck:    Midline trachea, no visible mass        Chest/Lungs:   Respiratory effort normal, no visualized signs of difficulty breathing or  respiratory distress        CVS:   No JVD     Extremities:   No obvious rashes noted on face, neck, or hands     Neuro:   No facial asymmetry, no focal deficits; no obvious tremor         Psych:   Normal affect,  normal countenance        Dunbar Buras is being evaluated by a Virtual Visit (video visit) encounter to address concerns as mentioned above.  A caregiver was present when  appropriate. 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  Pursuant to the emergency declaration  under the [facility] Act and the [employer], 1135 waiver authority and the [employer] Act, this Virtual Visit was conducted with patient's (and/or legal guardian's) consent, to reduce  the patient's risk of exposure to COVID-19 and provide  necessary medical care.        Patient identification was verified at the start of the visit: YES using name and date of birth. Patient's phone number [phone] (cell) was confirmed for accuracy.  He  gives permission for messages regarding results and appointments to be left at that number.      Services were provided through a video synchronous discussion virtually to substitute for in-person clinic visit.  I was in the office while  conducting this encounter, patient located at their home or alternate location of their choice.      An electronic signature was used to authenticate this note.      -- Cherre Huger, NP, [facility]   04/29/19

## 2019-10-09 ENCOUNTER — Encounter: Payer: Self-pay | Admitting: Medical

## 2019-10-09 ENCOUNTER — Other Ambulatory Visit: Payer: Self-pay

## 2019-10-09 ENCOUNTER — Ambulatory Visit (INDEPENDENT_AMBULATORY_CARE_PROVIDER_SITE_OTHER): Payer: 59 | Admitting: Medical

## 2019-10-09 VITALS — BP 113/78 | HR 75 | Temp 98.8°F | Resp 18 | Ht 72.0 in | Wt 290.6 lb

## 2019-10-09 DIAGNOSIS — Z23 Encounter for immunization: Secondary | ICD-10-CM | POA: Diagnosis not present

## 2019-10-09 DIAGNOSIS — F411 Generalized anxiety disorder: Secondary | ICD-10-CM

## 2019-10-09 DIAGNOSIS — I10 Essential (primary) hypertension: Secondary | ICD-10-CM

## 2019-10-09 NOTE — Addendum Note (Signed)
Addended by: Maximino Sarin on: 10/09/2019 09:23 AM   Modules accepted: Orders

## 2019-10-09 NOTE — Patient Instructions (Signed)
Your bp is well controlled. Continue amlodipine and lisinopril/hctz.  Anxiety controlled with citalopram. Continue.  Flu vaccine today.   Sign release of records from prior pcp. Wants to review those records and recent labs done for wellness. Try to get print out of most recent labs and bring by copies if possible.  Follow up 2-3 months or as needed.

## 2019-10-09 NOTE — Progress Notes (Signed)
Subjective:    Patient ID: Gurpreet Mikhail, male    DOB: Jun 29, 1970, 49 y.o.   MRN: [id number]  HPI  Pt  Inf for first time.  Pt moved from [place]. Had pcp there. He had annual visits with pcp. 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 No regular. Pt recently over past year cut out carbs. Pt lost 20 lbs with that diet. Pt drinks 2 cups coffee in am. Skips coffee on weekends. Drinks alcohol 2-4 mixed drinks 2-3 nights weeks. Smoke cigars when he drinks alcohol.   Pt has hx of htn. bp controlled. Hx of generalized anxiety. Doing well with celexa.  Pt had colonoscopy recently.  Pt will get flu vaccine today.  Pt got covid vaccines series moderna. Got this summer. He is not aware of dates.   Review of Systems  Constitutional: Negative for chills, fatigue and fever.  Respiratory: Negative for cough, chest tightness, shortness of breath and wheezing.   Cardiovascular: Negative for chest pain and palpitations.  Gastrointestinal: Negative for abdominal pain.  Genitourinary: Negative for flank pain, frequency and urgency.  Musculoskeletal: Negative for back pain and myalgias.  Skin: Negative for rash.  Neurological: Negative for dizziness, seizures, speech difficulty, weakness and headaches.  Hematological: Negative for adenopathy. Does not bruise/bleed easily.  Psychiatric/Behavioral: Negative for confusion and sleep disturbance. The patient is not nervous/anxious.    No past medical history on file.   Social History   Socioeconomic History  . Marital status: Married    Spouse name: Not on file  . Number of children: Not on file  . Years of education: Not on file  . Highest education level: Not on file  Occupational History  . Not on file  Tobacco Use  . Smoking status: Not on file  Substance and Sexual Activity  . Alcohol use: Not on file  . Drug use: Not on file  . Sexual activity: Not on file  Other Topics Concern  . Not on file  Social History Narrative  . Not on file   Social  Determinants of Health   Financial Resource Strain:   . Difficulty of Paying Living Expenses: Not on file  Food Insecurity:   . Worried About Programme researcher, broadcasting/film/video in the Last Year: Not on file  . Ran Out of Food in the Last Year: Not on file  Transportation Needs:   . Lack of Transportation (Medical): Not on file  . Lack of Transportation (Non-Medical): Not on file  Physical Activity:   . Days of Exercise per Week: Not on file  . Minutes of Exercise per Session: Not on file  Stress:   . Feeling of Stress : Not on file  Social Connections:   . Frequency of Communication with Friends and Family: Not on file  . Frequency of Social Gatherings with Friends and Family: Not on file  . Attends Religious Services: Not on file  . Active Member of Clubs or Organizations: Not on file  . Attends Banker Meetings: Not on file  . Marital Status: Not on file  Intimate Partner Violence:   . Fear of Current or Ex-Partner: Not on file  . Emotionally Abused: Not on file  . Physically Abused: Not on file  . Sexually Abused: Not on file     No family history on file.  Not on File  Current Outpatient Medications on File Prior to Visit  Medication Sig Dispense Refill  . amLODipine (NORVASC) 10 MG tablet Take 1 tablet by  mouth daily.    . citalopram (CELEXA) 20 MG tablet Take 1 tablet by mouth daily.    Marland Kitchen lisinopril-hydrochlorothiazide (ZESTORETIC) 20-12.5 MG tablet Take 1 tablet by mouth daily.     No current facility-administered medications on file prior to visit.    BP 113/78   Pulse 75   Temp 98.8 F (37.1 C) (Oral)   Resp 18   Ht 6' (1.829 m)   Wt 290 lb 9.6 oz (131.8 kg)   SpO2 95%   BMI 39.41 kg/m       Objective:   Physical Exam  General Mental Status- Alert. General Appearance- Not in acute distress.   Skin General: Color- Normal Color. Moisture- Normal Moisture.  Neck Carotid Arteries- Normal color. Moisture- Normal Moisture. No carotid bruits. No  JVD.  Chest and Lung Exam Auscultation: Breath Sounds:-Normal.  Cardiovascular Auscultation:Rythm- Regular. Murmurs & Other Heart Sounds:Auscultation of the heart reveals- No Murmurs.  Abdomen Inspection:-Inspeection Normal. Palpation/Percussion:Note:No mass. Palpation and Percussion of the abdomen reveal- Non Tender, Non Distended + BS, no rebound or guarding.  Neurologic Cranial Nerve exam:- CN III-XII intact(No nystagmus), symmetric smile. Strength:- 5/5 equal and symmetric strength both upper and lower extremities.       Assessment & Plan:  Your bp is well controlled. Continue amlodipine and lisinopril/hctz.  Anxiety controlled with citalopram. Continue.  Flu vaccine today.   Sign release of records from prior pcp. Wants to review those records and recent labs done for wellness. Try to get print out of most recent labs and bring by copies if possible.  Follow up 2-3 months or as needed.   Esperanza Richters, PA-C

## 2019-10-20 ENCOUNTER — Encounter: Payer: Self-pay | Admitting: Medical

## 2020-01-07 ENCOUNTER — Ambulatory Visit: Payer: 59 | Admitting: Medical

## 2020-01-08 ENCOUNTER — Ambulatory Visit: Payer: 59 | Admitting: Medical

## 2020-02-11 ENCOUNTER — Ambulatory Visit: Payer: 59 | Admitting: Medical

## 2020-04-02 ENCOUNTER — Ambulatory Visit: Payer: 59 | Admitting: Medical

## 2020-04-02 ENCOUNTER — Encounter: Payer: Self-pay | Admitting: Medical

## 2020-04-02 ENCOUNTER — Other Ambulatory Visit: Payer: Self-pay

## 2020-04-02 VITALS — BP 140/80 | HR 87 | Temp 98.2°F | Resp 20 | Ht 72.0 in | Wt 307.0 lb

## 2020-04-02 DIAGNOSIS — R1013 Epigastric pain: Secondary | ICD-10-CM

## 2020-04-02 DIAGNOSIS — I10 Essential (primary) hypertension: Secondary | ICD-10-CM

## 2020-04-02 DIAGNOSIS — F411 Generalized anxiety disorder: Secondary | ICD-10-CM | POA: Diagnosis not present

## 2020-04-02 MED ORDER — HYDROCHLOROTHIAZIDE 12.5 MG PO CAPS: 12.5000 mg | ORAL_CAPSULE | Freq: Every day | ORAL | 0 refills | Status: DC

## 2020-04-02 MED ORDER — FAMOTIDINE 20 MG PO TABS: ORAL_TABLET | ORAL | 0 refills | Status: DC

## 2020-04-02 MED ORDER — LISINOPRIL 40 MG PO TABS: 40.0000 mg | ORAL_TABLET | Freq: Every day | ORAL | 0 refills | Status: DC

## 2020-04-02 NOTE — Progress Notes (Signed)
Subjective:    Patient ID: Jacob Allison, male    DOB: 05/26/1970, 50 y.o.   MRN: [id number]  HPI  Pt in for follow up.  Seen in September.  Pt has hx of thn. Bp. BP high initially. He has not been checking his bp. He states January at dentist was normal. Pt is on zestoretic 20/12.5 daily and amlodipine 10 mg daily.    Pt is on celexa. Hx of anxiety. Pt states no panic attacks but daily moderate stress. Pt has been working from home.   Pt has not been exercising. He used to walk daily but stopped. Admits poor diet. Drinks alcohol 3 drinks 4-5 nights.    Pt states some epigastric fullness sensation. Belching. Some reflux symptoms. He takes tums every day.     Review of Systems  Constitutional: Negative for chills, fatigue and fever.  HENT: Negative for dental problem, ear pain, postnasal drip and sore throat.   Respiratory: Negative for cough, chest tightness, shortness of breath and wheezing.   Cardiovascular: Negative for chest pain and palpitations.  Gastrointestinal: Negative for abdominal pain.  Genitourinary: Negative for dysuria and flank pain.  Musculoskeletal: Negative for back pain and myalgias.  Skin: Negative for color change and rash.  Neurological: Negative for dizziness, syncope, weakness and headaches.  Hematological: Negative for adenopathy. Does not bruise/bleed easily.  Psychiatric/Behavioral: Negative for behavioral problems. The patient is not nervous/anxious and is not hyperactive.    Past Medical History:  Diagnosis Date  . Anxiety   . Hypertension      Social History   Socioeconomic History  . Marital status: Married    Spouse name: Not on file  . Number of children: Not on file  . Years of education: Not on file  . Highest education level: Not on file  Occupational History  . Occupation: Production designer, theatre/television/film pay checks  Tobacco Use  . Smoking status: Current Some Day Smoker    Types: Cigars  . Smokeless tobacco: Never Used  Substance and Sexual  Activity  . Alcohol use: Yes    Comment: 3 mixed drinks 3-4 times a week.  . Drug use: Never  . Sexual activity: Yes  Other Topics Concern  . Not on file  Social History Narrative  . Not on file   Social Determinants of Health   Financial Resource Strain: Not on file  Food Insecurity: Not on file  Transportation Needs: Not on file  Physical Activity: Not on file  Stress: Not on file  Social Connections: Not on file  Intimate Partner Violence: Not on file    No past surgical history on file.  No family history on file.  Not on File  Current Outpatient Medications on File Prior to Visit  Medication Sig Dispense Refill  . amLODipine (NORVASC) 10 MG tablet Take 1 tablet by mouth daily.    . citalopram (CELEXA) 20 MG tablet Take 1 tablet by mouth daily.    Marland Kitchen lisinopril-hydrochlorothiazide (ZESTORETIC) 20-12.5 MG tablet Take 1 tablet by mouth daily.     No current facility-administered medications on file prior to visit.    BP (!) 146/82   Pulse 87   Temp 98.2 F (36.8 C)   Resp 20   Ht 6' (1.829 m)   Wt (!) 307 lb (139.3 kg)   SpO2 93%   BMI 41.64 kg/m       Objective:   Physical Exam  General Mental Status- Alert. General Appearance- Not in acute distress.   Skin  General: Color- Normal Color. Moisture- Normal Moisture.  Neck Carotid Arteries- Normal color. Moisture- Normal Moisture. No carotid bruits. No JVD.  Chest and Lung Exam Auscultation: Breath Sounds:-Normal.  Cardiovascular Auscultation:Rythm- Regular. Murmurs & Other Heart Sounds:Auscultation of the heart reveals- No Murmurs.  Abdomen Inspection:-Inspeection Normal. Palpation/Percussion:Note:No mass. Palpation and Percussion of the abdomen reveal- Non Tender, Non Distended + BS, no rebound or guarding.    Neurologic Cranial Nerve exam:- CN III-XII intact(No nystagmus), symmetric smile. Strength:- 5/5 equal and symmetric strength both upper and lower extremities.        Assessment  & Plan:  Your blood pressure is elevated today.  Prefer blood pressure be 130/80 or little bit less.  Will increase your lisinopril to 40 mg daily.  Prescription of HCTZ 12.5mg  sent to your pharmacy.  Stop daily Zestoretic 20/12.5 combination tablet.  Continue amlodipine 10 mg daily.  Anxiety relatively controlled with Celexa.  Does show moderate daily stress reported.  I do think overall healthier lifestyle might be beneficial for your mood.  If not then could consider increasing dosage in the future.  Epigastric pain.  Likely reflux/GERD.  Recommend healthy diet changes and decrease alcohol.  Prescription of famotidine sent to pharmacy.  When labs done next week will include CBC, CMP, lipase and H. pylori breath test.  Obesity.  We discussed diet and exercise.  I do think weight watchers program is a healthy approach.  In addition weight loss would probably decrease your blood pressure level as well.  I do want your labs to be done fasting early morning since lipid panel will be done.  Follow-up in approximately 1 month or sooner if needed.  We will give you better idea on follow-up after lab review.  Time spent with patient today was 40  minutes which consisted of chart review(reviewing lab result not comment from former pcp), discussing diagnoses, work up, treatment,  and documentation.

## 2020-04-02 NOTE — Patient Instructions (Signed)
Your blood pressure is elevated today.  Prefer blood pressure be 130/80 or little bit less.  Will increase your lisinopril to 40 mg daily.  Prescription of HCTZ 12.5mg  sent to your pharmacy.  Stop daily Zestoretic 20/12.5 combination tablet.  Continue amlodipine 10 mg daily.  Anxiety relatively controlled with Celexa.  Does show moderate daily stress reported.  I do think overall healthier lifestyle might be beneficial for your mood.  If not then could consider increasing dosage in the future.  Epigastric pain.  Likely reflux/GERD.  Recommend healthy diet changes and decrease alcohol.  Prescription of famotidine sent to pharmacy.  When labs done next week will include CBC, CMP, lipase and H. pylori breath test.  Obesity.  We discussed diet and exercise.  I do think weight watchers program is a healthy approach.  In addition weight loss would probably decrease your blood pressure level as well.  I do want your labs to be done fasting early morning since lipid panel will be done.  Follow-up in approximately 1 month or sooner if needed.  We will give you better idea on follow-up after lab review.

## 2020-04-09 ENCOUNTER — Other Ambulatory Visit: Payer: Self-pay

## 2020-04-09 ENCOUNTER — Other Ambulatory Visit (INDEPENDENT_AMBULATORY_CARE_PROVIDER_SITE_OTHER): Payer: 59

## 2020-04-09 DIAGNOSIS — I10 Essential (primary) hypertension: Secondary | ICD-10-CM | POA: Diagnosis not present

## 2020-04-09 DIAGNOSIS — R1013 Epigastric pain: Secondary | ICD-10-CM | POA: Diagnosis not present

## 2020-04-09 LAB — COMPREHENSIVE METABOLIC PANEL
ALT: 88 U/L — ABNORMAL HIGH (ref 0–53)
AST: 41 U/L — ABNORMAL HIGH (ref 0–37)
Albumin: 4.3 g/dL (ref 3.5–5.2)
Alkaline Phosphatase: 44 U/L (ref 39–117)
BUN: 10 mg/dL (ref 6–23)
CO2: 27 mEq/L (ref 19–32)
Calcium: 9.1 mg/dL (ref 8.4–10.5)
Chloride: 106 mEq/L (ref 96–112)
Creatinine, Ser: 0.89 mg/dL (ref 0.40–1.50)
GFR: 99.5 mL/min (ref >60.00)
Glucose, Bld: 119 mg/dL — ABNORMAL HIGH (ref 70–99)
Potassium: 4.2 mEq/L (ref 3.5–5.1)
Sodium: 142 mEq/L (ref 135–145)
Total Bilirubin: 0.5 mg/dL (ref 0.2–1.2)
Total Protein: 6.8 g/dL (ref 6.0–8.3)

## 2020-04-09 LAB — CBC WITH DIFFERENTIAL/PLATELET
Basophils Absolute: 0 10*3/uL (ref 0.0–0.1)
Basophils Relative: 1 % (ref 0.0–3.0)
Eosinophils Absolute: 0.2 10*3/uL (ref 0.0–0.7)
Eosinophils Relative: 3.5 % (ref 0.0–5.0)
HCT: 43 % (ref 39.0–52.0)
Hemoglobin: 14.8 g/dL (ref 13.0–17.0)
Lymphocytes Relative: 24.2 % (ref 12.0–46.0)
Lymphs Abs: 1.1 10*3/uL (ref 0.7–4.0)
MCHC: 34.3 g/dL (ref 30.0–36.0)
MCV: 98.3 fl (ref 78.0–100.0)
Monocytes Absolute: 0.5 10*3/uL (ref 0.1–1.0)
Monocytes Relative: 10.6 % (ref 3.0–12.0)
Neutro Abs: 2.7 10*3/uL (ref 1.4–7.7)
Neutrophils Relative %: 60.7 % (ref 43.0–77.0)
Platelets: 225 10*3/uL (ref 150.0–400.0)
RBC: 4.38 Mil/uL (ref 4.22–5.81)
RDW: 13.2 % (ref 11.5–15.5)
WBC: 4.4 10*3/uL (ref 4.0–10.5)

## 2020-04-09 LAB — LIPID PANEL
Cholesterol: 149 mg/dL (ref 0–200)
HDL: 43.8 mg/dL (ref >39.00)
LDL Cholesterol: 88 mg/dL (ref 0–99)
NonHDL: 105.26
Total CHOL/HDL Ratio: 3
Triglycerides: 85 mg/dL (ref 0.0–149.0)
VLDL: 17 mg/dL (ref 0.0–40.0)

## 2020-04-09 LAB — LIPASE: Lipase: 31 U/L (ref 11.0–59.0)

## 2020-04-12 LAB — H. PYLORI BREATH TEST: H. pylori Breath Test: NOT DETECTED

## 2020-04-28 ENCOUNTER — Encounter: Payer: Self-pay | Admitting: Medical

## 2020-04-28 MED ORDER — FAMOTIDINE 20 MG PO TABS: ORAL_TABLET | ORAL | 0 refills | Status: DC

## 2020-05-03 ENCOUNTER — Encounter: Payer: Self-pay | Admitting: Medical

## 2020-05-04 ENCOUNTER — Ambulatory Visit: Attending: Adult Health | Primary: Internal Medicine

## 2020-05-20 ENCOUNTER — Encounter: Payer: Self-pay | Admitting: Medical

## 2020-05-20 NOTE — Telephone Encounter (Signed)
Called pt to schedule appt.

## 2020-05-21 ENCOUNTER — Ambulatory Visit: Payer: 59 | Admitting: Medical

## 2020-05-21 ENCOUNTER — Other Ambulatory Visit: Payer: Self-pay

## 2020-05-21 ENCOUNTER — Encounter: Payer: Self-pay | Admitting: Medical

## 2020-05-21 VITALS — BP 118/72 | HR 87 | Temp 98.9°F | Wt 310.4 lb

## 2020-05-21 DIAGNOSIS — R21 Rash and other nonspecific skin eruption: Secondary | ICD-10-CM

## 2020-05-21 DIAGNOSIS — R748 Abnormal levels of other serum enzymes: Secondary | ICD-10-CM | POA: Diagnosis not present

## 2020-05-21 NOTE — Patient Instructions (Addendum)
For rt upper ext rash with some features of ring worm advise lamisil/terbinafine  otc twice daily. DC other antifungals  I don't think oral antifungal necessary presently  but could consider short course oral lamisil if not improving with topical. Also may refer to dermatologist.  Want to recheck cmp in event short course of oral lamisil prescribed. Discussed your slight elevated lft. Want to review cmp before considering adding on oral antifungal.  Discussed elevated lft and possible fatty liver. Education on causes of fatty liver and foods/beverage/meds to avoid. Might get [place] abd after lab review.  Follow up 2-3 week or as needed.

## 2020-05-21 NOTE — Progress Notes (Signed)
   Subjective:    Patient ID: Jacob Allison, male    DOB: January 15, 1971, 50 y.o.   MRN: [id number]  HPI  Pt in with rt side bicep area rash for about 3 weeks.The rash is itching. Pt has been using lotrimin ultra seemed to not help. He states it is itching moderately. He thinks lotrimin working better.  Also 2 small pink area medial thigh.  On review pt had some mild elevated liver enzymes. In the past. Pt states in the past his liver enzymes were elevated on and off. Pt is not sure if he drank.    Review of Systems  Constitutional: Negative for chills, fatigue and fever.  Cardiovascular: Negative for chest pain.  Skin: Positive for rash.       Objective:   Physical Exam  General- No acute distress. Pleasant patient. Neck- Full range of motion, no jvd Lungs- Clear, even and unlabored. Heart- regular rate and rhythm. Neurologic- CNII- XII grossly intact.  Skin- right upper extremity  medial aspect rash present with red appearance raised edges and some central clearing.  No warmth to palpation and no tenderness.  Some features ringworm like.  Left lower extremity-2 small slight pink areas about 4 mm in diameter.  Not warm or tender to palpation.  No vesicles seen.      Assessment & Plan:  For rt upper ext rash with some features of ring worm advise lamisil otc twice daily.  I don't think oral antifungal necessary but could consider short course oral lamisil if not improving. Also may refer to dermatologist.  Want to recheck cmp in event short course of oral lamisil prescribed.  Discussed elevated lft and possible fatty liver. Education on causes of fatty liver and foods/beverage/meds to avoid. Might get [place] abd after lab review.  Follow up 2-3 week or as needed.  Time spent with patient today was 25  minutes which consisted of chart review, discussing diagnosis, work up, treatment and documentation.

## 2020-05-22 ENCOUNTER — Encounter: Payer: Self-pay | Admitting: Medical

## 2020-05-22 ENCOUNTER — Telehealth: Payer: Self-pay | Admitting: Medical

## 2020-05-22 DIAGNOSIS — R748 Abnormal levels of other serum enzymes: Secondary | ICD-10-CM

## 2020-05-22 LAB — COMPREHENSIVE METABOLIC PANEL
AG Ratio: 1.7 (calc) (ref 1.0–2.5)
ALT: 57 U/L — ABNORMAL HIGH (ref 9–46)
AST: 36 U/L — ABNORMAL HIGH (ref 10–35)
Albumin: 4.5 g/dL (ref 3.6–5.1)
Alkaline phosphatase (APISO): 45 U/L (ref 35–144)
BUN: 12 mg/dL (ref 7–25)
CO2: 28 mmol/L (ref 20–32)
Calcium: 9.5 mg/dL (ref 8.6–10.3)
Chloride: 103 mmol/L (ref 98–110)
Creat: 1.02 mg/dL (ref 0.70–1.33)
Globulin: 2.6 g/dL (calc) (ref 1.9–3.7)
Glucose, Bld: 101 mg/dL — ABNORMAL HIGH (ref 65–99)
Potassium: 3.8 mmol/L (ref 3.5–5.3)
Sodium: 142 mmol/L (ref 135–146)
Total Bilirubin: 0.6 mg/dL (ref 0.2–1.2)
Total Protein: 7.1 g/dL (ref 6.1–8.1)

## 2020-05-22 NOTE — Telephone Encounter (Signed)
Future US order placed. 

## 2020-05-24 ENCOUNTER — Ambulatory Visit: Payer: 59 | Admitting: Medical

## 2020-05-24 ENCOUNTER — Other Ambulatory Visit: Payer: Self-pay | Admitting: Medical

## 2020-05-26 ENCOUNTER — Encounter: Payer: Self-pay | Admitting: Medical

## 2020-05-27 ENCOUNTER — Ambulatory Visit (HOSPITAL_BASED_OUTPATIENT_CLINIC_OR_DEPARTMENT_OTHER)
Admission: RE | Admit: 2020-05-27 | Discharge: 2020-05-27 | Disposition: A | Discharge status: Home / Self Care | Payer: 59 | Source: Ambulatory Visit | Attending: Medical | Admitting: Medical

## 2020-05-27 ENCOUNTER — Other Ambulatory Visit: Payer: Self-pay

## 2020-05-27 DIAGNOSIS — R748 Abnormal levels of other serum enzymes: Secondary | ICD-10-CM | POA: Diagnosis present

## 2020-05-28 ENCOUNTER — Ambulatory Visit: Payer: 59 | Admitting: Medical

## 2020-05-28 ENCOUNTER — Other Ambulatory Visit: Payer: Self-pay

## 2020-05-28 ENCOUNTER — Other Ambulatory Visit (HOSPITAL_BASED_OUTPATIENT_CLINIC_OR_DEPARTMENT_OTHER): Payer: Self-pay

## 2020-05-28 VITALS — BP 137/84 | HR 83 | Resp 18 | Ht 72.0 in | Wt 310.0 lb

## 2020-05-28 DIAGNOSIS — R21 Rash and other nonspecific skin eruption: Secondary | ICD-10-CM

## 2020-05-28 DIAGNOSIS — L089 Local infection of the skin and subcutaneous tissue, unspecified: Secondary | ICD-10-CM | POA: Diagnosis not present

## 2020-05-28 LAB — CBC WITH DIFFERENTIAL/PLATELET
Absolute Monocytes: 761 cells/uL (ref 200–950)
Basophils Absolute: 59 cells/uL (ref 0–200)
Basophils Relative: 1.1 %
Eosinophils Absolute: 173 cells/uL (ref 15–500)
Eosinophils Relative: 3.2 %
HCT: 44.1 % (ref 38.5–50.0)
Hemoglobin: 14.8 g/dL (ref 13.2–17.1)
Lymphs Abs: 1242 cells/uL (ref 850–3900)
MCH: 33 pg (ref 27.0–33.0)
MCHC: 33.6 g/dL (ref 32.0–36.0)
MCV: 98.4 fL (ref 80.0–100.0)
MPV: 9.6 fL (ref 7.5–12.5)
Monocytes Relative: 14.1 %
Neutro Abs: 3164 cells/uL (ref 1500–7800)
Neutrophils Relative %: 58.6 %
Platelets: 243 10*3/uL (ref 140–400)
RBC: 4.48 10*6/uL (ref 4.20–5.80)
RDW: 12.9 % (ref 11.0–15.0)
Total Lymphocyte: 23 %
WBC: 5.4 10*3/uL (ref 3.8–10.8)

## 2020-05-28 MED ORDER — CEFTRIAXONE SODIUM 1 G IJ SOLR
1.0000 g | Freq: Once | INTRAMUSCULAR | Status: AC
  Administered 2020-05-28: 1 g via INTRAMUSCULAR

## 2020-05-28 MED ORDER — DOXYCYCLINE HYCLATE 100 MG PO TABS
100.0000 mg | ORAL_TABLET | Freq: Two times a day (BID) | ORAL | 0 refills | Status: DC
  Filled 2020-05-28: qty 20, 10d supply, fill #0

## 2020-05-28 NOTE — Progress Notes (Signed)
Subjective:    Patient ID: Jacob Allison, male    DOB: [birth year], 50 y.o.   MRN: [id number]  HPI  Pt in with rt upper extremity.  Pt had area on skin that had v  features that looked like possible fungal infection/tinea. I switch to topical lamisil after he had used topical lotrimin.  On Monday he states area got very itchy and redness came up distal edge of the rash. The other night when he rubbed area felt some moisture.   No fever, no chills or sweats.       Review of Systems  Constitutional: Negative for chills and fatigue.  Respiratory: Negative for cough, chest tightness, shortness of breath and wheezing.   Cardiovascular: Negative for chest pain and palpitations.  Gastrointestinal: Negative for abdominal pain.  Skin: Positive for rash.  Neurological: Negative for dizziness and headaches.  Hematological: Negative for adenopathy. Does not bruise/bleed easily.  Psychiatric/Behavioral: Negative for behavioral problems and confusion.    Past Medical History:  Diagnosis Date  . Anxiety   . Hypertension      Social History   Socioeconomic History  . Marital status: Married    Spouse name: Not on file  . Number of children: Not on file  . Years of education: Not on file  . Highest education level: Not on file  Occupational History  . Occupation: Production designer, theatre/television/film pay checks  Tobacco Use  . Smoking status: Current Some Day Smoker    Types: Cigars  . Smokeless tobacco: Never Used  Substance and Sexual Activity  . Alcohol use: Yes    Comment: 3 mixed drinks 3-4 times a week.  . Drug use: Never  . Sexual activity: Yes  Other Topics Concern  . Not on file  Social History Narrative  . Not on file   Social Determinants of Health   Financial Resource Strain: Not on file  Food Insecurity: Not on file  Transportation Needs: Not on file  Physical Activity: Not on file  Stress: Not on file  Social Connections: Not on file  Intimate Partner Violence: Not on file    No  past surgical history on file.  No family history on file.  Not on File  Current Outpatient Medications on File Prior to Visit  Medication Sig Dispense Refill  . amLODipine (NORVASC) 10 MG tablet Take 1 tablet by mouth daily.    . citalopram (CELEXA) 20 MG tablet Take 1 tablet by mouth daily.    . famotidine (PEPCID) 20 MG tablet TAKE 1 TABLET BY MOUTH  DAILY 30 tablet 11  . hydrochlorothiazide (MICROZIDE) 12.5 MG capsule TAKE 1 CAPSULE BY MOUTH  DAILY 90 capsule 3  . lisinopril (ZESTRIL) 40 MG tablet TAKE 1 TABLET BY MOUTH  DAILY 90 tablet 3   No current facility-administered medications on file prior to visit.    BP 137/84   Pulse 83   Resp 18   Ht 6' (1.829 m)   Wt (!) 310 lb (140.6 kg)   SpO2 97%   BMI 42.04 kg/m      Objective:   Physical Exam  General- No acute distress. Pleasant patient. Neck- Full range of motion, no jvd Lungs- Clear, even and unlabored. Heart- regular rate and rhythm. Neurologic- CNII- XII grossly intact.  Rt arm- medial bicep. 3.5 in  x 3.5 in   Approximate size rash area. Distal edge reddish color, indurated and warm(but not tender). Moist feel to skin. But no dc. No vesicles seen. Pt sent me  my chart picture today)        Assessment & Plan:  You had recent small area right medial bicep that has some features of possible fungal infection.  Some initial improvement with Lamisil but since Monday rashes moderate to severe bright reddness distal aspect and on exam that region feels indurated.  Currently appears more like skin infection/early cellulitis.  We gave you 1 g Rocephin IM in the office and you can start doxycycline tonight.  Rx advisement given.  Can eat probiotic rich foods and also can use probiotic tabs over-the-counter.  If area of redness expands down towards the hand or up towards your axillary area then recommend ED evaluation.  Culture of distal red area taken today.  We will send that out.  Will get CBC lab  today.  Presently you can hold use of Lamisil.  Send me update on Monday on how area is doing.  Might need actual office visit Monday afternoon or Tuesday.  Depending on how you do clinically with treatment might refer you to a dermatologist.

## 2020-05-28 NOTE — Patient Instructions (Signed)
You had recent small area right medial bicep that has some features of possible fungal infection.  Some initial improvement with Lamisil but since Monday rashes moderate to severe bright reddness distal aspect and on exam that region feels indurated.  Currently appears more like skin infection/early cellulitis.  We gave you 1 g Rocephin IM in the office and you can start doxycycline tonight.  Rx advisement given.  Can eat probiotic rich foods and also can use probiotic tabs over-the-counter.  If area of redness expands down towards the hand or up towards your axillary area then recommend ED evaluation.  Culture of distal red area taken today.  We will send that out.  Will get CBC lab today.  Presently you can hold use of Lamisil.  Send me update on Monday on how area is doing.  Might need actual office visit Monday afternoon or Tuesday.  Depending on how you do clinically with treatment might refer you to a dermatologist.

## 2020-05-31 ENCOUNTER — Encounter: Payer: Self-pay | Admitting: Medical

## 2020-05-31 LAB — WOUND CULTURE
MICRO NUMBER:: 11859899
SPECIMEN QUALITY:: ADEQUATE

## 2020-06-02 ENCOUNTER — Other Ambulatory Visit: Payer: Self-pay

## 2020-06-02 ENCOUNTER — Ambulatory Visit: Payer: 59 | Admitting: Medical

## 2020-06-02 VITALS — BP 145/80 | HR 78 | Resp 20 | Ht 73.0 in | Wt 310.8 lb

## 2020-06-02 DIAGNOSIS — L089 Local infection of the skin and subcutaneous tissue, unspecified: Secondary | ICD-10-CM

## 2020-06-02 NOTE — Progress Notes (Signed)
Subjective:    Patient ID: Jacob Allison, male    DOB: October 25, 1970, 50 y.o.   MRN: [id number]  HPI  Pt in for follow up.  Pt states distal aspect of rt arm felt a lot better after injection of rocephin. Some better after 24 hours after injection. He started doxycycline.   No itching. He stopped the antifungals that he used early on.  No fever, no chills or sweats.  Progressive improvement each day.  On review/discussion pt never had tick bite.   Review of Systems  Constitutional: Negative for chills, fatigue and fever.  Respiratory: Negative for chest tightness, shortness of breath and wheezing.   Cardiovascular: Negative for chest pain and palpitations.  Gastrointestinal: Negative for abdominal pain.  Musculoskeletal: Negative for back pain.  Neurological: Negative for dizziness, speech difficulty, weakness, numbness and headaches.  Hematological: Negative for adenopathy. Does not bruise/bleed easily.  Psychiatric/Behavioral: Negative for behavioral problems and confusion.   Past Medical History:  Diagnosis Date  . Anxiety   . Hypertension      Social History   Socioeconomic History  . Marital status: Married    Spouse name: Not on file  . Number of children: Not on file  . Years of education: Not on file  . Highest education level: Not on file  Occupational History  . Occupation: Production designer, theatre/television/film pay checks  Tobacco Use  . Smoking status: Current Some Day Smoker    Types: Cigars  . Smokeless tobacco: Never Used  Substance and Sexual Activity  . Alcohol use: Yes    Comment: 3 mixed drinks 3-4 times a week.  . Drug use: Never  . Sexual activity: Yes  Other Topics Concern  . Not on file  Social History Narrative  . Not on file   Social Determinants of Health   Financial Resource Strain: Not on file  Food Insecurity: Not on file  Transportation Needs: Not on file  Physical Activity: Not on file  Stress: Not on file  Social Connections: Not on file  Intimate  Partner Violence: Not on file    No past surgical history on file.  No family history on file.  Not on File  Current Outpatient Medications on File Prior to Visit  Medication Sig Dispense Refill  . amLODipine (NORVASC) 10 MG tablet Take 1 tablet by mouth daily.    . citalopram (CELEXA) 20 MG tablet Take 1 tablet by mouth daily.    . famotidine (PEPCID) 20 MG tablet TAKE 1 TABLET BY MOUTH  DAILY 30 tablet 11  . hydrochlorothiazide (MICROZIDE) 12.5 MG capsule TAKE 1 CAPSULE BY MOUTH  DAILY 90 capsule 3  . lisinopril (ZESTRIL) 40 MG tablet TAKE 1 TABLET BY MOUTH  DAILY 90 tablet 3   No current facility-administered medications on file prior to visit.    BP (!) 145/80   Pulse 78   Resp 20   Ht 6\' 1"  (1.854 m)   Wt (!) 310 lb 12.8 oz (141 kg)   SpO2 96%   BMI 41.01 kg/m       Objective:   Physical Exam  General- No acute distress. Pleasant patient. Neck- Full range of motion, no jvd Lungs- Clear, even and unlabored. Heart- regular rate and rhythm. Neurologic- CNII- XII grossly intact. Rt medial upper arm- rash looks much drier now. Former bright red and indurated distal aspect no longer red or indurated. Area is still same diameter. Majority of area in center portion normal.       Assessment &  Plan:  Area has responded to injection of rocephin and doxycycline oral antibiotic. I want you to moisturize the area at least once daily. Can use lotion with vit E.  You report progessive improvement. Characteristics are better no longer red, warm and indurated but diameter of area is about the same.   Follow up 3-5 days or as needed  [employer], PA-C

## 2020-06-02 NOTE — Patient Instructions (Addendum)
Area that appeared infected  has responded to injection of rocephin and doxycycline oral antibiotic(continue doxy). I want you to moisturize the area at least once daily. Can use lotion with vit E.  You report progressive improvement. Characteristics are better no longer red, warm and indurated but diameter of area is about the same. Want to follow and make sure still progressive improvement. If not still consider derm referral if not complete resolution)  Follow up 3-5 days or as needed(send me my chart picture update)

## 2020-06-04 ENCOUNTER — Telehealth: Payer: Self-pay | Admitting: Medical

## 2020-06-04 ENCOUNTER — Encounter: Payer: Self-pay | Admitting: Medical

## 2020-06-04 DIAGNOSIS — R21 Rash and other nonspecific skin eruption: Secondary | ICD-10-CM

## 2020-06-04 NOTE — Telephone Encounter (Signed)
Appointment made

## 2020-06-04 NOTE — Telephone Encounter (Signed)
Will call pt and let him know we have been trying to get scheduled with derm. Soonest so far is 2 weeks out . So would you schedule him for appointment on Monday with me 8 am.  Since this is persisting and delayed will check with Dr. Abner Greenspan. Want second opinion.  When he palpates area does he have any tenderness?

## 2020-06-04 NOTE — Telephone Encounter (Signed)
Referral to dermatologist placed. 

## 2020-06-07 ENCOUNTER — Other Ambulatory Visit (HOSPITAL_BASED_OUTPATIENT_CLINIC_OR_DEPARTMENT_OTHER): Payer: Self-pay

## 2020-06-07 ENCOUNTER — Other Ambulatory Visit: Payer: Self-pay

## 2020-06-07 ENCOUNTER — Ambulatory Visit: Payer: 59 | Admitting: Medical

## 2020-06-07 VITALS — BP 128/77 | HR 75 | Resp 20 | Ht 73.0 in | Wt 314.8 lb

## 2020-06-07 DIAGNOSIS — R21 Rash and other nonspecific skin eruption: Secondary | ICD-10-CM

## 2020-06-07 MED ORDER — DOXYCYCLINE HYCLATE 100 MG PO TABS
100.0000 mg | ORAL_TABLET | Freq: Two times a day (BID) | ORAL | 0 refills | Status: DC
  Filled 2020-06-07: qty 10, 5d supply, fill #0

## 2020-06-07 MED ORDER — FLUCONAZOLE 150 MG PO TABS
ORAL_TABLET | ORAL | 0 refills | Status: DC
  Filled 2020-06-07: qty 2, 7d supply, fill #0

## 2020-06-07 MED ORDER — KETOCONAZOLE 1 % EX SHAM
MEDICATED_SHAMPOO | CUTANEOUS | 0 refills | Status: DC
  Filled 2020-06-07: qty 125, fill #0

## 2020-06-07 NOTE — Progress Notes (Signed)
Subjective:    Patient ID: Jacob Allison, male    DOB: 10/17/1970, 50 y.o.   MRN: [id number]  HPI  Pt has faint persisting rash still present. The rash much less prominent but still present . No redness, now swelling or discharge. No pain on palpation.  Pt former bright red area is now normal.   Pt on last pill of doxycycline.    Review of Systems  Constitutional: Negative for chills, fatigue and fever.  Respiratory: Negative for cough, chest tightness and wheezing.   Cardiovascular: Negative for chest pain and palpitations.  Gastrointestinal: Negative for abdominal pain, blood in stool and constipation.  Musculoskeletal: Negative for back pain, myalgias and neck stiffness.  Psychiatric/Behavioral: Negative for confusion, self-injury and suicidal ideas.    Past Medical History:  Diagnosis Date  . Anxiety   . Hypertension      Social History   Socioeconomic History  . Marital status: Married    Spouse name: Not on file  . Number of children: Not on file  . Years of education: Not on file  . Highest education level: Not on file  Occupational History  . Occupation: Production designer, theatre/television/film pay checks  Tobacco Use  . Smoking status: Current Some Day Smoker    Types: Cigars  . Smokeless tobacco: Never Used  Substance and Sexual Activity  . Alcohol use: Yes    Comment: 3 mixed drinks 3-4 times a week.  . Drug use: Never  . Sexual activity: Yes  Other Topics Concern  . Not on file  Social History Narrative  . Not on file   Social Determinants of Health   Financial Resource Strain: Not on file  Food Insecurity: Not on file  Transportation Needs: Not on file  Physical Activity: Not on file  Stress: Not on file  Social Connections: Not on file  Intimate Partner Violence: Not on file    No past surgical history on file.  No family history on file.  Not on File  Current Outpatient Medications on File Prior to Visit  Medication Sig Dispense Refill  . amLODipine (NORVASC) 10  MG tablet Take 1 tablet by mouth daily.    . citalopram (CELEXA) 20 MG tablet Take 1 tablet by mouth daily.    . famotidine (PEPCID) 20 MG tablet TAKE 1 TABLET BY MOUTH  DAILY 30 tablet 11  . hydrochlorothiazide (MICROZIDE) 12.5 MG capsule TAKE 1 CAPSULE BY MOUTH  DAILY 90 capsule 3  . lisinopril (ZESTRIL) 40 MG tablet TAKE 1 TABLET BY MOUTH  DAILY 90 tablet 3   No current facility-administered medications on file prior to visit.    BP 128/77   Pulse 75   Resp 20   Ht 6\' 1"  (1.854 m)   Wt (!) 314 lb 12.8 oz (142.8 kg)   SpO2 97%   BMI 41.53 kg/m       Objective:   Physical Exam  General- No acute distress. Pleasant patient.   Rt upper arm- 3.5 inch diameter sized rash slight hyperpigmented in appearance. No warmth or induration presently.   Derm- small scatter hyperpigmented rash medial thigh. Area abou 10 mm in diameter.     Assessment & Plan:  Your rash is still present. Dr. thought fungal infection likely initial cause with secondary bacterial infection as discussed early on.   Presently the bacterial infection appears largely resolved.(Will give additional 5 days of doxycycline).  Early fungal features still persist particularly proximal portion of the rash area.  Will prescribe  Diflucan 150 mg tab to use 1 tablet today and another in 1week.  Also use of ketoconazole shampoo 3 times weekly.  We had contacted dermatologist at [facility].  Their number is [phone].  If they do not call you today then recommend that you call them.  See if he can get appointment within 7 to 10days.  Follow-up with [place] as needed.  If rash changes or worsens please let us know.

## 2020-06-07 NOTE — Patient Instructions (Addendum)
Your rash is still present. Dr. Abner Greenspan thought fungal infection likely initial cause with secondary bacterial infection as discussed early on.   Presently the bacterial infection appears largely resolved.(Will give additional 5 days of doxycycline).  Early fungal features still persist particularly proximal portion of the rash area.  Will prescribe Diflucan 150 mg tab to use 1 tablet today and another in 1week.  Also use of ketoconazole shampoo 3 times weekly.  We had contacted dermatologist at [facility].  Their number is [phone].  If they do not call you today then recommend that you call them.  See if he can get appointment within 7 to 10days.  Follow-up with [place] as needed.  If rash changes or worsens please let us know.

## 2020-07-16 ENCOUNTER — Ambulatory Visit: Payer: 59

## 2020-07-16 NOTE — Progress Notes (Signed)
   Covid-19 Vaccination Clinic  Name:  Jacob Allison    MRN: [id number] DOB: [birth year]  07/16/2020  [name] was observed post Covid-19 immunization for 15 minutes without incident. He was provided with Vaccine Information Sheet and instruction to access the V-Safe system.   [name] was instructed to call 911 with any severe reactions post vaccine: Difficulty breathing  Swelling of face and throat  A fast heartbeat  A bad rash all over body  Dizziness and weakness

## 2020-07-19 ENCOUNTER — Other Ambulatory Visit (HOSPITAL_BASED_OUTPATIENT_CLINIC_OR_DEPARTMENT_OTHER): Payer: Self-pay

## 2020-07-19 MED ORDER — COVID-19 MRNA VACC (MODERNA) 100 MCG/0.5ML IM SUSP
INTRAMUSCULAR | 0 refills | Status: AC
  Filled 2020-07-19: qty 0.25, 1d supply, fill #0

## 2020-07-20 ENCOUNTER — Encounter: Payer: Self-pay | Admitting: Medical

## 2020-08-24 ENCOUNTER — Encounter: Payer: Self-pay | Admitting: Medical

## 2020-08-31 ENCOUNTER — Ambulatory Visit: Payer: 59 | Admitting: Orthopaedic Surgery

## 2020-08-31 ENCOUNTER — Encounter: Payer: Self-pay | Admitting: Orthopaedic Surgery

## 2020-08-31 ENCOUNTER — Other Ambulatory Visit: Payer: Self-pay

## 2020-08-31 DIAGNOSIS — M65312 Trigger thumb, left thumb: Secondary | ICD-10-CM

## 2020-08-31 DIAGNOSIS — M65341 Trigger finger, right ring finger: Secondary | ICD-10-CM | POA: Diagnosis not present

## 2020-08-31 MED ORDER — BUPIVACAINE HCL 0.5 % IJ SOLN
0.3300 mL | INTRAMUSCULAR | Status: AC | PRN
  Administered 2020-08-31: .33 mL

## 2020-08-31 MED ORDER — METHYLPREDNISOLONE ACETATE 40 MG/ML IJ SUSP
13.3300 mg | INTRAMUSCULAR | Status: AC | PRN
  Administered 2020-08-31: 13.33 mg

## 2020-08-31 MED ORDER — LIDOCAINE HCL 1 % IJ SOLN
0.3000 mL | INTRAMUSCULAR | Status: AC | PRN
  Administered 2020-08-31: .3 mL

## 2020-08-31 NOTE — Progress Notes (Signed)
Office Visit Note   Patient: Jacob Allison           Date of Birth: 04-04-1970           MRN: [id number] Visit Date: 08/31/2020              Requested by: Esperanza Richters, PA-C [address] [place],  [place] [address] PCP: Esperanza Richters, PA-C   Assessment & Plan: Visit Diagnoses:  1. Trigger thumb, left thumb   2. 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  Treatment options discussed in terms of cortisone injection versus oral medication and splinting for a brief period of time.  He would like to go with the left trigger thumb injection today and he will try immobilization for both fingers for the next couple weeks at nighttime.  We will see him back as needed.  Follow-Up Instructions: Return if symptoms worsen or fail to improve.   Orders:  No orders of the defined types were placed in this encounter.  No orders of the defined types were placed in this encounter.     Procedures: Hand/UE Inj: L thumb A1 for trigger finger on 08/31/2020 9:18 AM Indications: pain Details: 25 G needle Medications: 0.3 mL lidocaine 1 %; 0.33 mL bupivacaine 0.5 %; 13.33 mg methylPREDNISolone acetate 40 MG/ML Outcome: tolerated well, no immediate complications Consent was given by the patient. Patient was prepped and draped in the usual sterile fashion.      Clinical Data: No additional findings.   Subjective: Chief Complaint  Patient presents with   Left Hand - Pain    Jacob Allison is a very pleasant 50 year old gentleman here for evaluation of left thumb locking and catching and right ring finger tender nodule in the palm.  He is a saxophonist.  Right-hand-dominant.  Denies any injuries.  Denies diabetes.   Review of Systems  Constitutional: Negative.   All other systems reviewed and are negative.   Objective: Vital Signs: There were no vitals taken for this  visit.  Physical Exam Vitals and nursing note reviewed.  Constitutional:      Appearance: He is well-developed.  HENT:     Head: Normocephalic and atraumatic.  Eyes:     Pupils: Pupils are equal, round, and reactive to light.  Pulmonary:     Effort: Pulmonary effort is normal.  Abdominal:     Palpations: Abdomen is soft.  Musculoskeletal:        General: Normal range of motion.     Cervical back: Neck supple.  Skin:    General: Skin is warm.  Neurological:     Mental Status: He is alert and oriented to person, place, and time.  Psychiatric:        Behavior: Behavior normal.        Thought Content: Thought content normal.        Judgment: Judgment normal.    Ortho Exam Left thumb shows active triggering and catching with tender nodule at the base of the thumb.  Negative grind test.  Negative Finkelstein's.  Right ring finger shows tender nodule in the palm at the distal palmar flexion crease.  There is no active triggering.  Specialty Comments:  No specialty comments available.  Imaging: No results found.   PMFS History: There are no problems to display for this patient.  Past Medical History:  Diagnosis Date   Anxiety  Hypertension     History reviewed. No pertinent family history.  History reviewed. No pertinent surgical history. Social History   Occupational History   Occupation: Production designer, theatre/television/film pay checks  Tobacco Use   Smoking status: Some Days    Types: Cigars   Smokeless tobacco: Never  Substance and Sexual Activity   Alcohol use: Yes    Comment: 3 mixed drinks 3-4 times a week.   Drug use: Never   Sexual activity: Yes

## 2021-01-14 ENCOUNTER — Encounter: Payer: Self-pay | Admitting: Medical

## 2021-01-14 MED ORDER — LISINOPRIL 40 MG PO TABS: 40.0000 mg | ORAL_TABLET | Freq: Every day | ORAL | 3 refills | Status: DC

## 2021-01-14 MED ORDER — AMLODIPINE BESYLATE 10 MG PO TABS: 10.0000 mg | ORAL_TABLET | Freq: Every day | ORAL | 3 refills | Status: DC

## 2021-02-01 ENCOUNTER — Other Ambulatory Visit (HOSPITAL_BASED_OUTPATIENT_CLINIC_OR_DEPARTMENT_OTHER): Payer: Self-pay | Admitting: Orthopaedic Surgery

## 2021-02-01 DIAGNOSIS — M25559 Pain in unspecified hip: Secondary | ICD-10-CM

## 2021-02-02 ENCOUNTER — Ambulatory Visit (HOSPITAL_BASED_OUTPATIENT_CLINIC_OR_DEPARTMENT_OTHER)
Admission: RE | Admit: 2021-02-02 | Discharge: 2021-02-02 | Disposition: A | Discharge status: Home / Self Care | Payer: 59 | Source: Ambulatory Visit | Attending: Orthopaedic Surgery | Admitting: Orthopaedic Surgery

## 2021-02-02 ENCOUNTER — Other Ambulatory Visit: Payer: Self-pay

## 2021-02-02 ENCOUNTER — Ambulatory Visit (HOSPITAL_BASED_OUTPATIENT_CLINIC_OR_DEPARTMENT_OTHER): Payer: 59 | Admitting: Orthopaedic Surgery

## 2021-02-02 DIAGNOSIS — M25559 Pain in unspecified hip: Secondary | ICD-10-CM | POA: Insufficient documentation

## 2021-02-02 DIAGNOSIS — S76011A Strain of muscle, fascia and tendon of right hip, initial encounter: Secondary | ICD-10-CM | POA: Diagnosis not present

## 2021-02-02 NOTE — Progress Notes (Signed)
Chief Complaint: Right hip pain     History of Present Illness:    Jacob Allison is a 51 y.o. male presents with lateral based hip pain that has been going on for several months since August 2022.  He states that walking he will experience a sudden dull pain about the lateral aspect of the hip.  He has increased pain with walking.  He currently works from home at [employer].  He avoids laying directly on that side.  At this the pain.  He will occasionally experience to get the clinic on the inner part of the hip although this is not necessarily associated with pain    Surgical History:   None  PMH/PSH/Family History/Social History/Meds/Allergies:    Past Medical History:  Diagnosis Date   Anxiety    Hypertension    No past surgical history on file. Social History   Socioeconomic History   Marital status: Married    Spouse name: Not on file   Number of children: Not on file   Years of education: Not on file   Highest education level: Not on file  Occupational History   Occupation: Freight forwarder pay checks  Tobacco Use   Smoking status: Some Days    Types: Cigars   Smokeless tobacco: Never  Substance and Sexual Activity   Alcohol use: Yes    Comment: 3 mixed drinks 3-4 times a week.   Drug use: Never   Sexual activity: Yes  Other Topics Concern   Not on file  Social History Narrative   Not on file   Social Determinants of Health   Financial Resource Strain: Not on file  Food Insecurity: Not on file  Transportation Needs: Not on file  Physical Activity: Not on file  Stress: Not on file  Social Connections: Not on file   No family history on file. Not on File Current Outpatient Medications  Medication Sig Dispense Refill   amLODipine (NORVASC) 10 MG tablet Take 1 tablet (10 mg total) by mouth daily. 90 tablet 3   citalopram (CELEXA) 20 MG tablet Take 1 tablet by mouth daily.     COVID-19 mRNA vaccine, Moderna, 100  MCG/0.5ML injection Inject into the muscle. 0.25 mL 0   doxycycline (VIBRA-TABS) 100 MG tablet Take 1 tablet (100 mg total) by mouth 2 (two) times daily. 10 tablet 0   famotidine (PEPCID) 20 MG tablet TAKE 1 TABLET BY MOUTH  DAILY 30 tablet 11   fluconazole (DIFLUCAN) 150 MG tablet Take 1 tablet by mouth today and repeat in one week. 2 tablet 0   hydrochlorothiazide (MICROZIDE) 12.5 MG capsule TAKE 1 CAPSULE BY MOUTH  DAILY 90 capsule 3   KETOCONAZOLE, TOPICAL, 1 % SHAM Apply shampoo three times weekly 125 mL 0   lisinopril (ZESTRIL) 40 MG tablet Take 1 tablet (40 mg total) by mouth daily. 90 tablet 3   No current facility-administered medications for this visit.   No results found.  Review of Systems:   A ROS was performed including pertinent positives and negatives as documented in the HPI.  Physical Exam :   Constitutional: NAD and appears stated age Neurological: Alert and oriented Psych: Appropriate affect and cooperative There were no vitals taken for this visit.   Comprehensive Musculoskeletal Exam:    Inspection Right Left  Skin No atrophy or gross abnormalities appreciated No atrophy or gross abnormalities appreciated  Palpation    Tenderness None None  Crepitus None None  Range of Motion    Flexion (passive) 120 120  Extension 30 30  IR 30 no pain 30  ER 45 45  Strength    Flexion  5/5 5/5  Extension 5/5 5/5  Special Tests    FABIR Negative Negative  FADER Negative Negative  ER Lag/Capsular Insufficiency Negative Negative  Instability Negative Negative  Sacroiliac pain Negative  Negative   Instability    Generalized Laxity No No  Neurologic    sciatic, femoral, obturator nerves intact to light sensation  Vascular/Lymphatic    DP pulse 2+ 2+  Lumbar Exam    Patient has symmetric lumbar range of motion with negative pain referral to hip  Positive pain about greater troches with resisted abduction.  Significant weakness with single leg raise and single-leg  squat on the right side   Imaging:   Xray (3 views right hip): Normal right hip   I personally reviewed and interpreted the radiographs.   Assessment:   51 year old male with right hip trochanteric pain consistent with gluteus medius tendinitis.  At this time I would like to offer him an ultrasound-guided steroid injection into the glutamine.  I would also like to give him a dedicated set of home instructions so that he can work on a home exercise program 3-4 times a week.  I will see him back in 2 months to discuss results.  Plan :    -Return to clinic in 2 months     I personally saw and evaluated the patient, and participated in the management and treatment plan.  Vanetta Mulders, MD Attending Physician, Orthopedic Surgery  This document was dictated using Dragon voice recognition software. A reasonable attempt at proof reading has been made to minimize errors.

## 2021-02-08 ENCOUNTER — Other Ambulatory Visit (HOSPITAL_BASED_OUTPATIENT_CLINIC_OR_DEPARTMENT_OTHER): Payer: Self-pay

## 2021-02-08 MED ORDER — IBUPROFEN 800 MG PO TABS
ORAL_TABLET | ORAL | 0 refills | Status: DC
  Filled 2021-02-08: qty 30, 10d supply, fill #0

## 2021-03-03 ENCOUNTER — Encounter: Payer: Self-pay | Admitting: Medical

## 2021-03-04 ENCOUNTER — Ambulatory Visit: Payer: 59 | Admitting: Medical

## 2021-03-04 ENCOUNTER — Other Ambulatory Visit (HOSPITAL_BASED_OUTPATIENT_CLINIC_OR_DEPARTMENT_OTHER): Payer: Self-pay

## 2021-03-04 ENCOUNTER — Encounter: Payer: Self-pay | Admitting: Medical

## 2021-03-04 VITALS — BP 140/80 | HR 85 | Temp 98.2°F | Resp 18 | Ht 73.0 in | Wt 312.2 lb

## 2021-03-04 DIAGNOSIS — R748 Abnormal levels of other serum enzymes: Secondary | ICD-10-CM | POA: Diagnosis not present

## 2021-03-04 DIAGNOSIS — I10 Essential (primary) hypertension: Secondary | ICD-10-CM | POA: Diagnosis not present

## 2021-03-04 DIAGNOSIS — R739 Hyperglycemia, unspecified: Secondary | ICD-10-CM | POA: Diagnosis not present

## 2021-03-04 DIAGNOSIS — H04301 Unspecified dacryocystitis of right lacrimal passage: Secondary | ICD-10-CM | POA: Diagnosis not present

## 2021-03-04 LAB — COMPREHENSIVE METABOLIC PANEL
ALT: 54 U/L — ABNORMAL HIGH (ref 0–53)
AST: 34 U/L (ref 0–37)
Albumin: 4.1 g/dL (ref 3.5–5.2)
Alkaline Phosphatase: 47 U/L (ref 39–117)
BUN: 16 mg/dL (ref 6–23)
CO2: 30 mEq/L (ref 19–32)
Calcium: 9.5 mg/dL (ref 8.4–10.5)
Chloride: 105 mEq/L (ref 96–112)
Creatinine, Ser: 0.87 mg/dL (ref 0.40–1.50)
GFR: 99.55 mL/min (ref >60.00)
Glucose, Bld: 95 mg/dL (ref 70–99)
Potassium: 3.8 mEq/L (ref 3.5–5.1)
Sodium: 141 mEq/L (ref 135–145)
Total Bilirubin: 0.4 mg/dL (ref 0.2–1.2)
Total Protein: 6.9 g/dL (ref 6.0–8.3)

## 2021-03-04 LAB — HEMOGLOBIN A1C: Hgb A1c MFr Bld: 5.9 % (ref 4.6–6.5)

## 2021-03-04 MED ORDER — MOXIFLOXACIN HCL 0.5 % OP SOLN
1.0000 [drp] | Freq: Three times a day (TID) | OPHTHALMIC | 0 refills | Status: DC
  Filled 2021-03-04: qty 3, 20d supply, fill #0

## 2021-03-04 MED ORDER — TOBRAMYCIN 0.3 % OP SOLN
2.0000 [drp] | Freq: Four times a day (QID) | OPHTHALMIC | 0 refills | Status: DC
  Filled 2021-03-04: qty 5, 13d supply, fill #0

## 2021-03-04 MED ORDER — CEPHALEXIN 500 MG PO CAPS
500.0000 mg | ORAL_CAPSULE | Freq: Two times a day (BID) | ORAL | 0 refills | Status: DC
  Filled 2021-03-04: qty 20, 10d supply, fill #0

## 2021-03-04 NOTE — Patient Instructions (Addendum)
Possible early dacrocystitis vs early skin lower lid skin infection. Can do warm compresses twice daily. Start tobrex eye drops. If redness were to expand or lid to swell significantly then start oral antibiotic keflex.   Bp borderline mild elevation. Continue 3 med regimen. Recommend getting bp cuff over the counter. Check bp 3-4 times over next week when relaxed. Discuss technique. Would like to see bp closer to 130/80. Update me by my chart in 7-10 days.  For elevated sugar and increased lft did order cmp and a1c today.   Follow up in 7 days if lower lid not back to baseline or sooner if needed.

## 2021-03-04 NOTE — Progress Notes (Signed)
Subjective:    Patient ID: Jacob Allison, male    DOB: [birth year], 51 y.o.   MRN: [id number]  HPI Yesterday woke up with medial rt side eye lower lid swelling and mild-moderate soreness. Today the size is about the same. More tender. No vision changes, no matting or fb injury.   Htn- on hctz, amlodipine and zestril. Pt does not check bp at home. No bp cuff at home.  Mild elevated sugar level last year and elevated liver enzymes. Some fatty liver.  Review of Systems  Constitutional:  Negative for chills, fatigue and fever.  Eyes:  Negative for photophobia, pain, discharge, redness, itching and visual disturbance.       See hpi.  Respiratory:  Negative for cough, chest tightness and shortness of breath.   Cardiovascular:  Negative for chest pain and palpitations.  Musculoskeletal:  Negative for back pain and joint swelling.  Skin:  Negative for rash.  Psychiatric/Behavioral:  Negative for behavioral problems and decreased concentration.     Past Medical History:  Diagnosis Date   Anxiety    Hypertension      Social History   Socioeconomic History   Marital status: Married    Spouse name: Not on file   Number of children: Not on file   Years of education: Not on file   Highest education level: Not on file  Occupational History   Occupation: Freight forwarder pay checks  Tobacco Use   Smoking status: Some Days    Types: Cigars   Smokeless tobacco: Never  Substance and Sexual Activity   Alcohol use: Yes    Comment: 3 mixed drinks 3-4 times a week.   Drug use: Never   Sexual activity: Yes  Other Topics Concern   Not on file  Social History Narrative   Not on file   Social Determinants of Health   Financial Resource Strain: Not on file  Food Insecurity: Not on file  Transportation Needs: Not on file  Physical Activity: Not on file  Stress: Not on file  Social Connections: Not on file  Intimate Partner Violence: Not on file    No past surgical history on file.  No  family history on file.  Not on File  Current Outpatient Medications on File Prior to Visit  Medication Sig Dispense Refill   amLODipine (NORVASC) 10 MG tablet Take 1 tablet (10 mg total) by mouth daily. 90 tablet 3   citalopram (CELEXA) 20 MG tablet Take 1 tablet by mouth daily.     COVID-19 mRNA vaccine, Moderna, 100 MCG/0.5ML injection Inject into the muscle. 0.25 mL 0   famotidine (PEPCID) 20 MG tablet TAKE 1 TABLET BY MOUTH  DAILY 30 tablet 11   fluconazole (DIFLUCAN) 150 MG tablet Take 1 tablet by mouth today and repeat in one week. 2 tablet 0   hydrochlorothiazide (MICROZIDE) 12.5 MG capsule TAKE 1 CAPSULE BY MOUTH  DAILY 90 capsule 3   ibuprofen (ADVIL) 800 MG tablet Take 1 tablet by mouth every 8 hours as needed for pain 30 tablet 0   KETOCONAZOLE, TOPICAL, 1 % SHAM Apply shampoo three times weekly 125 mL 0   lisinopril (ZESTRIL) 40 MG tablet Take 1 tablet (40 mg total) by mouth daily. 90 tablet 3   No current facility-administered medications on file prior to visit.    BP (!) 150/80    Pulse 85    Temp 98.2 F (36.8 C)    Resp 18    Ht 6\' 1"  (1.854 m)  Wt (!) 312 lb 3.2 oz (141.6 kg)    SpO2 96%    BMI 41.19 kg/m       Objective:   Physical Exam  General- No acute distress. Pleasant patient. Neck- Full range of motion, no jvd Lungs- Clear, even and unlabored. Heart- regular rate and rhythm. Neurologic- CNII- XII grossly intact.  Eyes- perrl bilateral. Eom intact. Rt lower lid medial aspect adjaent to medial canthus mild red and swollen. Not tender to palpation. No matting or eye redness.       Assessment & Plan:   Patient Instructions  Possible early dacrocystitis vs early skin lower lid skin infection. Can do warm compresses twice daily. Start tobrex eye drops. If redness were to expand or lid to swell significantly then start oral antibiotic keflex.   Bp borderline mild elevation. Continue 3 med regimen. Recommend getting bp cuff over the counter. Check bp 3-4  times over next week when relaxed. Discuss technique. Would like to see bp closer to 130/80. Update me by my chart in 7-10 days.  For elevated sugar and increased lft did order cmp and a1c today.   Follow up in 7 days if lower lid not back to baseline or sooner if needed.      Mackie Pai, PA-C

## 2021-03-04 NOTE — Addendum Note (Signed)
Addended by: Gwenevere Abbot on: 03/04/2021 09:14 AM   Modules accepted: Orders

## 2021-03-09 ENCOUNTER — Encounter: Payer: Self-pay | Admitting: Medical

## 2021-03-28 ENCOUNTER — Ambulatory Visit (HOSPITAL_BASED_OUTPATIENT_CLINIC_OR_DEPARTMENT_OTHER): Payer: 59 | Admitting: Orthopaedic Surgery

## 2021-04-14 ENCOUNTER — Other Ambulatory Visit: Payer: Self-pay | Admitting: Medical

## 2021-06-09 ENCOUNTER — Other Ambulatory Visit (HOSPITAL_BASED_OUTPATIENT_CLINIC_OR_DEPARTMENT_OTHER): Payer: Self-pay

## 2021-06-09 MED ORDER — AZITHROMYCIN 250 MG PO TABS
ORAL_TABLET | ORAL | 0 refills | Status: DC
  Filled 2021-06-09 – 2021-07-05 (×2): qty 2, 1d supply, fill #0

## 2021-06-17 ENCOUNTER — Other Ambulatory Visit (HOSPITAL_BASED_OUTPATIENT_CLINIC_OR_DEPARTMENT_OTHER): Payer: Self-pay

## 2021-07-05 ENCOUNTER — Other Ambulatory Visit (HOSPITAL_BASED_OUTPATIENT_CLINIC_OR_DEPARTMENT_OTHER): Payer: Self-pay

## 2021-08-06 ENCOUNTER — Encounter: Payer: Self-pay | Admitting: Medical

## 2021-08-08 MED ORDER — CITALOPRAM HYDROBROMIDE 20 MG PO TABS: 20.0000 mg | ORAL_TABLET | Freq: Every day | ORAL | 0 refills | Status: DC

## 2021-09-28 ENCOUNTER — Ambulatory Visit (INDEPENDENT_AMBULATORY_CARE_PROVIDER_SITE_OTHER): Payer: 59 | Admitting: Orthopaedic Surgery

## 2021-09-28 DIAGNOSIS — S76011A Strain of muscle, fascia and tendon of right hip, initial encounter: Secondary | ICD-10-CM | POA: Diagnosis not present

## 2021-09-28 NOTE — Progress Notes (Signed)
Chief Complaint: Right hip pain     History of Present Illness:   09/28/2021: Jacob Allison today presents for recurrent right hip pain.  He states he got several months of extremely good relief from his previous gluteus medius ultrasound-guided injection.  He is here today with pain particularly after vacation in [place] where he was going up and down many inclines streets.  His lateral hip pain has recurred since then.  Jacob Allison is a 51 y.o. male presents with lateral based hip pain that has been going on for several months since August 2022.  He states that walking he will experience a sudden dull pain about the lateral aspect of the hip.  He has increased pain with walking.  He currently works from home at [employer].  He avoids laying directly on that side.  At this the pain.  He will occasionally experience to get the clinic on the inner part of the hip although this is not necessarily associated with pain    Surgical History:   None  PMH/PSH/Family History/Social History/Meds/Allergies:    Past Medical History:  Diagnosis Date   Anxiety    Hypertension    No past surgical history on file. Social History   Socioeconomic History   Marital status: Married    Spouse name: Not on file   Number of children: Not on file   Years of education: Not on file   Highest education level: Not on file  Occupational History   Occupation: Production designer, theatre/television/film pay checks  Tobacco Use   Smoking status: Some Days    Types: Cigars   Smokeless tobacco: Never  Substance and Sexual Activity   Alcohol use: Yes    Comment: 3 mixed drinks 3-4 times a week.   Drug use: Never   Sexual activity: Yes  Other Topics Concern   Not on file  Social History Narrative   Not on file   Social Determinants of Health   Financial Resource Strain: Not on file  Food Insecurity: Not on file  Transportation Needs: Not on file  Physical Activity: Not on file  Stress:  Not on file  Social Connections: Not on file   No family history on file. Not on File Current Outpatient Medications  Medication Sig Dispense Refill   amLODipine (NORVASC) 10 MG tablet Take 1 tablet (10 mg total) by mouth daily. 90 tablet 3   azithromycin (ZITHROMAX) 250 MG tablet Take 2 tablets by mouth 1 hour prior to surgery 2 tablet 0   cephALEXin (KEFLEX) 500 MG capsule Take 1 capsule (500 mg total) by mouth 2 (two) times daily. 20 capsule 0   citalopram (CELEXA) 20 MG tablet Take 1 tablet (20 mg total) by mouth daily. 90 tablet 0   COVID-19 mRNA vaccine, Moderna, 100 MCG/0.5ML injection Inject into the muscle. 0.25 mL 0   famotidine (PEPCID) 20 MG tablet TAKE 1 TABLET BY MOUTH  DAILY 90 tablet 3   fluconazole (DIFLUCAN) 150 MG tablet Take 1 tablet by mouth today and repeat in one week. 2 tablet 0   hydrochlorothiazide (MICROZIDE) 12.5 MG capsule TAKE 1 CAPSULE BY MOUTH  DAILY 90 capsule 3   ibuprofen (ADVIL) 800 MG tablet Take 1 tablet by mouth every 8 hours as needed for pain 30 tablet 0   KETOCONAZOLE, TOPICAL,  1 % SHAM Apply shampoo three times weekly 125 mL 0   lisinopril (ZESTRIL) 40 MG tablet Take 1 tablet (40 mg total) by mouth daily. 90 tablet 3   moxifloxacin (VIGAMOX) 0.5 % ophthalmic solution Place 1 drop into the right eye 3 (three) times daily. 3 mL 0   No current facility-administered medications for this visit.   No results found.  Review of Systems:   A ROS was performed including pertinent positives and negatives as documented in the HPI.  Physical Exam :   Constitutional: NAD and appears stated age Neurological: Alert and oriented Psych: Appropriate affect and cooperative There were no vitals taken for this visit.   Comprehensive Musculoskeletal Exam:    Inspection Right Left  Skin No atrophy or gross abnormalities appreciated No atrophy or gross abnormalities appreciated  Palpation    Tenderness None None  Crepitus None None  Range of Motion     Flexion (passive) 120 120  Extension 30 30  IR 30 no pain 30  ER 45 45  Strength    Flexion  5/5 5/5  Extension 5/5 5/5  Special Tests    FABIR Negative Negative  FADER Negative Negative  ER Lag/Capsular Insufficiency Negative Negative  Instability Negative Negative  Sacroiliac pain Negative  Negative   Instability    Generalized Laxity No No  Neurologic    sciatic, femoral, obturator nerves intact to light sensation  Vascular/Lymphatic    DP pulse 2+ 2+  Lumbar Exam    Patient has symmetric lumbar range of motion with negative pain referral to hip  Positive pain about greater troches with resisted abduction.  Significant weakness with single leg raise and single-leg squat on the right side   Imaging:   Xray (3 views right hip): Normal right hip   I personally reviewed and interpreted the radiographs.   Assessment:   51 year old male with right hip trochanteric pain consistent with gluteus medius tendinitis.  Given the fact that he did not get good relief from an ultrasound-guided injection I do believe an MRI would be indicated at this time to rule out any type of underlying tear.  Based on that we will consider additional injections versus further discussion of treatment options Plan :    -Plan for right hip MRI and follow-up to discuss results     I personally saw and evaluated the patient, and participated in the management and treatment plan.  Huel Cote, MD Attending Physician, Orthopedic Surgery  This document was dictated using Dragon voice recognition software. A reasonable attempt at proof reading has been made to minimize errors.

## 2021-09-29 ENCOUNTER — Ambulatory Visit (HOSPITAL_BASED_OUTPATIENT_CLINIC_OR_DEPARTMENT_OTHER): Payer: 59 | Admitting: Orthopaedic Surgery

## 2021-10-15 ENCOUNTER — Ambulatory Visit
Admission: RE | Admit: 2021-10-15 | Discharge: 2021-10-15 | Disposition: A | Discharge status: Home / Self Care | Payer: 59 | Source: Ambulatory Visit | Attending: Orthopaedic Surgery | Admitting: Orthopaedic Surgery

## 2021-10-15 DIAGNOSIS — S76011A Strain of muscle, fascia and tendon of right hip, initial encounter: Secondary | ICD-10-CM

## 2021-10-21 ENCOUNTER — Ambulatory Visit (INDEPENDENT_AMBULATORY_CARE_PROVIDER_SITE_OTHER): Payer: 59 | Admitting: Orthopaedic Surgery

## 2021-10-21 ENCOUNTER — Other Ambulatory Visit (HOSPITAL_BASED_OUTPATIENT_CLINIC_OR_DEPARTMENT_OTHER): Payer: Self-pay

## 2021-10-21 DIAGNOSIS — M8438XA Stress fracture, other site, initial encounter for fracture: Secondary | ICD-10-CM | POA: Diagnosis not present

## 2021-10-21 MED ORDER — MELOXICAM 15 MG PO TABS
15.0000 mg | ORAL_TABLET | Freq: Every day | ORAL | 0 refills | Status: AC
  Filled 2021-10-21: qty 14, 14d supply, fill #0

## 2021-10-21 NOTE — Progress Notes (Signed)
Chief Complaint: Right hip pain     History of Present Illness:     10/21/2021: Presents today for MRI follow-up of his right hip.  He is having persistent symptoms although things are gradually improving slowly.  Linton Stolp is a 51 y.o. male presents with lateral based hip pain that has been going on for several months since August 2022.  He states that walking he will experience a sudden dull pain about the lateral aspect of the hip.  He has increased pain with walking.  He currently works from home at [employer].  He avoids laying directly on that side.  At this the pain.  He will occasionally experience to get the clinic on the inner part of the hip although this is not necessarily associated with pain    Surgical History:   None  PMH/PSH/Family History/Social History/Meds/Allergies:    Past Medical History:  Diagnosis Date   Anxiety    Hypertension    No past surgical history on file. Social History   Socioeconomic History   Marital status: Married    Spouse name: Not on file   Number of children: Not on file   Years of education: Not on file   Highest education level: Not on file  Occupational History   Occupation: Production designer, theatre/television/film pay checks  Tobacco Use   Smoking status: Some Days    Types: Cigars   Smokeless tobacco: Never  Substance and Sexual Activity   Alcohol use: Yes    Comment: 3 mixed drinks 3-4 times a week.   Drug use: Never   Sexual activity: Yes  Other Topics Concern   Not on file  Social History Narrative   Not on file   Social Determinants of Health   Financial Resource Strain: Not on file  Food Insecurity: Not on file  Transportation Needs: Not on file  Physical Activity: Not on file  Stress: Not on file  Social Connections: Not on file   No family history on file. Not on File Current Outpatient Medications  Medication Sig Dispense Refill   meloxicam (MOBIC) 15 MG tablet Take 1 tablet  (15 mg total) by mouth daily for 14 days. 14 tablet 0   amLODipine (NORVASC) 10 MG tablet Take 1 tablet (10 mg total) by mouth daily. 90 tablet 3   azithromycin (ZITHROMAX) 250 MG tablet Take 2 tablets by mouth 1 hour prior to surgery 2 tablet 0   cephALEXin (KEFLEX) 500 MG capsule Take 1 capsule (500 mg total) by mouth 2 (two) times daily. 20 capsule 0   citalopram (CELEXA) 20 MG tablet Take 1 tablet (20 mg total) by mouth daily. 90 tablet 0   COVID-19 mRNA vaccine, Moderna, 100 MCG/0.5ML injection Inject into the muscle. 0.25 mL 0   famotidine (PEPCID) 20 MG tablet TAKE 1 TABLET BY MOUTH  DAILY 90 tablet 3   fluconazole (DIFLUCAN) 150 MG tablet Take 1 tablet by mouth today and repeat in one week. 2 tablet 0   hydrochlorothiazide (MICROZIDE) 12.5 MG capsule TAKE 1 CAPSULE BY MOUTH  DAILY 90 capsule 3   ibuprofen (ADVIL) 800 MG tablet Take 1 tablet by mouth every 8 hours as needed for pain 30 tablet 0   KETOCONAZOLE, TOPICAL, 1 % SHAM Apply shampoo three times weekly 125 mL 0  lisinopril (ZESTRIL) 40 MG tablet Take 1 tablet (40 mg total) by mouth daily. 90 tablet 3   moxifloxacin (VIGAMOX) 0.5 % ophthalmic solution Place 1 drop into the right eye 3 (three) times daily. 3 mL 0   No current facility-administered medications for this visit.   No results found.  Review of Systems:   A ROS was performed including pertinent positives and negatives as documented in the HPI.  Physical Exam :   Constitutional: NAD and appears stated age Neurological: Alert and oriented Psych: Appropriate affect and cooperative There were no vitals taken for this visit.   Comprehensive Musculoskeletal Exam:    Inspection Right Left  Skin No atrophy or gross abnormalities appreciated No atrophy or gross abnormalities appreciated  Palpation    Tenderness None None  Crepitus None None  Range of Motion    Flexion (passive) 120 120  Extension 30 30  IR 30 no pain 30  ER 45 45  Strength    Flexion  5/5 5/5   Extension 5/5 5/5  Special Tests    FABIR Negative Negative  FADER Negative Negative  ER Lag/Capsular Insufficiency Negative Negative  Instability Negative Negative  Sacroiliac pain Negative  Negative   Instability    Generalized Laxity No No  Neurologic    sciatic, femoral, obturator nerves intact to light sensation  Vascular/Lymphatic    DP pulse 2+ 2+  Lumbar Exam    Patient has symmetric lumbar range of motion with negative pain referral to hip  Positive pain about greater troches with resisted abduction.  Significant weakness with single leg raise and single-leg squat on the right side   Imaging:   Xray (3 views right hip): Normal right hip  MRI right hip: There is a stress reaction involving the acetabulum superiorly  I personally reviewed and interpreted the radiographs.   Assessment:   51 year old male with right hip pain consistent with a right acetabular stress reaction.  At today's visit I have counseled him on a period of partial weightbearing with a cane as well as a 2-week course of Mobic.  We will plan to begin with this.  I will see him back in 4 weeks for reassessment Plan :    -Plan for return to clinic in 4 weeks for reassessment     I personally saw and evaluated the patient, and participated in the management and treatment plan.  Vanetta Mulders, MD Attending Physician, Orthopedic Surgery  This document was dictated using Dragon voice recognition software. A reasonable attempt at proof reading has been made to minimize errors.

## 2021-10-26 ENCOUNTER — Ambulatory Visit (HOSPITAL_BASED_OUTPATIENT_CLINIC_OR_DEPARTMENT_OTHER): Payer: 59 | Admitting: Orthopaedic Surgery

## 2021-11-03 ENCOUNTER — Other Ambulatory Visit: Payer: Self-pay | Admitting: Medical

## 2021-11-08 ENCOUNTER — Encounter: Payer: Self-pay | Admitting: Medical

## 2021-11-08 MED ORDER — CITALOPRAM HYDROBROMIDE 20 MG PO TABS: 20.0000 mg | ORAL_TABLET | Freq: Every day | ORAL | 0 refills | Status: DC

## 2021-11-18 ENCOUNTER — Ambulatory Visit (HOSPITAL_BASED_OUTPATIENT_CLINIC_OR_DEPARTMENT_OTHER): Payer: 59 | Admitting: Orthopaedic Surgery

## 2021-11-18 DIAGNOSIS — M8438XA Stress fracture, other site, initial encounter for fracture: Secondary | ICD-10-CM

## 2021-11-18 NOTE — Progress Notes (Signed)
Chief Complaint: Right hip pain     History of Present Illness:     11/18/2021: Presents today for follow-up.  He has been using a cane in the right hand.  He states that he got significant pain relief from Mobic.  He is here today for further assessment.  Overall has little to no pain at this time.  Jacob Allison is a 51 y.o. male presents with lateral based hip pain that has been going on for several months since August 2022.  He states that walking he will experience a sudden dull pain about the lateral aspect of the hip.  He has increased pain with walking.  He currently works from home at [employer].  He avoids laying directly on that side.  At this the pain.  He will occasionally experience to get the clinic on the inner part of the hip although this is not necessarily associated with pain    Surgical History:   None  PMH/PSH/Family History/Social History/Meds/Allergies:    Past Medical History:  Diagnosis Date   Anxiety    Hypertension    No past surgical history on file. Social History   Socioeconomic History   Marital status: Married    Spouse name: Not on file   Number of children: Not on file   Years of education: Not on file   Highest education level: Not on file  Occupational History   Occupation: Production designer, theatre/television/film pay checks  Tobacco Use   Smoking status: Some Days    Types: Cigars   Smokeless tobacco: Never  Substance and Sexual Activity   Alcohol use: Yes    Comment: 3 mixed drinks 3-4 times a week.   Drug use: Never   Sexual activity: Yes  Other Topics Concern   Not on file  Social History Narrative   Not on file   Social Determinants of Health   Financial Resource Strain: Not on file  Food Insecurity: Not on file  Transportation Needs: Not on file  Physical Activity: Not on file  Stress: Not on file  Social Connections: Not on file   No family history on file. Not on File Current Outpatient  Medications  Medication Sig Dispense Refill   amLODipine (NORVASC) 10 MG tablet Take 1 tablet (10 mg total) by mouth daily. 90 tablet 3   azithromycin (ZITHROMAX) 250 MG tablet Take 2 tablets by mouth 1 hour prior to surgery 2 tablet 0   cephALEXin (KEFLEX) 500 MG capsule Take 1 capsule (500 mg total) by mouth 2 (two) times daily. 20 capsule 0   citalopram (CELEXA) 20 MG tablet Take 1 tablet (20 mg total) by mouth daily. 90 tablet 0   COVID-19 mRNA vaccine, Moderna, 100 MCG/0.5ML injection Inject into the muscle. 0.25 mL 0   famotidine (PEPCID) 20 MG tablet TAKE 1 TABLET BY MOUTH  DAILY 90 tablet 3   fluconazole (DIFLUCAN) 150 MG tablet Take 1 tablet by mouth today and repeat in one week. 2 tablet 0   hydrochlorothiazide (MICROZIDE) 12.5 MG capsule TAKE 1 CAPSULE BY MOUTH  DAILY 90 capsule 3   ibuprofen (ADVIL) 800 MG tablet Take 1 tablet by mouth every 8 hours as needed for pain 30 tablet 0   KETOCONAZOLE, TOPICAL, 1 % SHAM Apply shampoo three times weekly 125 mL 0  lisinopril (ZESTRIL) 40 MG tablet Take 1 tablet (40 mg total) by mouth daily. 90 tablet 3   moxifloxacin (VIGAMOX) 0.5 % ophthalmic solution Place 1 drop into the right eye 3 (three) times daily. 3 mL 0   No current facility-administered medications for this visit.   No results found.  Review of Systems:   A ROS was performed including pertinent positives and negatives as documented in the HPI.  Physical Exam :   Constitutional: NAD and appears stated age Neurological: Alert and oriented Psych: Appropriate affect and cooperative There were no vitals taken for this visit.   Comprehensive Musculoskeletal Exam:    Inspection Right Left  Skin No atrophy or gross abnormalities appreciated No atrophy or gross abnormalities appreciated  Palpation    Tenderness None None  Crepitus None None  Range of Motion    Flexion (passive) 120 120  Extension 30 30  IR 30 no pain 30  ER 45 45  Strength    Flexion  5/5 5/5   Extension 5/5 5/5  Special Tests    FABIR Negative Negative  FADER Negative Negative  ER Lag/Capsular Insufficiency Negative Negative  Instability Negative Negative  Sacroiliac pain Negative  Negative   Instability    Generalized Laxity No No  Neurologic    sciatic, femoral, obturator nerves intact to light sensation  Vascular/Lymphatic    DP pulse 2+ 2+  Lumbar Exam    Patient has symmetric lumbar range of motion with negative pain referral to hip  Positive pain about greater troches with resisted abduction.  Significant weakness with single leg raise and single-leg squat on the right side   Imaging:   Xray (3 views right hip): Normal right hip  MRI right hip: There is a stress reaction involving the acetabulum superiorly  I personally reviewed and interpreted the radiographs.   Assessment:   51 year old male with right hip pain consistent with a right acetabular stress reaction.  At this time he does feel dramatically better with Mobic and partial weightbearing with a cane.  I advised that he may wearing off of his cane at this time.  I will plan to see him as needed Plan :    -Plan for return to clinic as needed     I personally saw and evaluated the patient, and participated in the management and treatment plan.  Vanetta Mulders, MD Attending Physician, Orthopedic Surgery  This document was dictated using Dragon voice recognition software. A reasonable attempt at proof reading has been made to minimize errors.

## 2021-11-21 ENCOUNTER — Other Ambulatory Visit (HOSPITAL_BASED_OUTPATIENT_CLINIC_OR_DEPARTMENT_OTHER): Payer: Self-pay

## 2021-11-21 MED ORDER — CITALOPRAM HYDROBROMIDE 20 MG PO TABS: 20.0000 mg | ORAL_TABLET | Freq: Every day | ORAL | 0 refills | Status: DC

## 2021-11-21 MED ORDER — CITALOPRAM HYDROBROMIDE 20 MG PO TABS
20.0000 mg | ORAL_TABLET | Freq: Every day | ORAL | 0 refills | Status: DC
  Filled 2021-11-21: qty 90, 90d supply, fill #0

## 2021-11-21 NOTE — Addendum Note (Signed)
Addended by: Jeronimo Greaves on: 11/21/2021 08:50 AM   Modules accepted: Orders

## 2021-11-21 NOTE — Addendum Note (Signed)
Addended by: Jeronimo Greaves on: 11/21/2021 09:31 AM   Modules accepted: Orders

## 2021-12-18 ENCOUNTER — Other Ambulatory Visit: Payer: Self-pay | Admitting: Medical

## 2022-02-12 ENCOUNTER — Other Ambulatory Visit: Payer: Self-pay | Admitting: Medical

## 2022-02-13 ENCOUNTER — Other Ambulatory Visit (HOSPITAL_COMMUNITY): Payer: Self-pay

## 2022-02-13 MED ORDER — CITALOPRAM HYDROBROMIDE 20 MG PO TABS
20.0000 mg | ORAL_TABLET | Freq: Every day | ORAL | 0 refills | Status: DC
  Filled 2022-02-13: qty 90, 90d supply, fill #0

## 2022-02-15 ENCOUNTER — Other Ambulatory Visit (HOSPITAL_COMMUNITY): Payer: Self-pay

## 2022-03-13 ENCOUNTER — Other Ambulatory Visit: Payer: Self-pay | Admitting: Medical

## 2022-03-29 ENCOUNTER — Other Ambulatory Visit: Payer: Self-pay | Admitting: Medical

## 2022-11-06 ENCOUNTER — Other Ambulatory Visit: Payer: Self-pay | Admitting: Medical

## 2022-12-02 ENCOUNTER — Other Ambulatory Visit: Payer: Self-pay | Admitting: Medical

## 2022-12-18 IMAGING — US US ABDOMEN LIMITED RUQ/ASCITES
1 series · 14 of 25 positions shown · non-contrast
Comparison: None.

CLINICAL DATA: Elevated liver enzymes

EXAM:
ULTRASOUND ABDOMEN LIMITED RIGHT UPPER QUADRANT

[Series 1: us abdomen limited ruq/ascites · 14 of 31 slices shown]
[im 1/31]
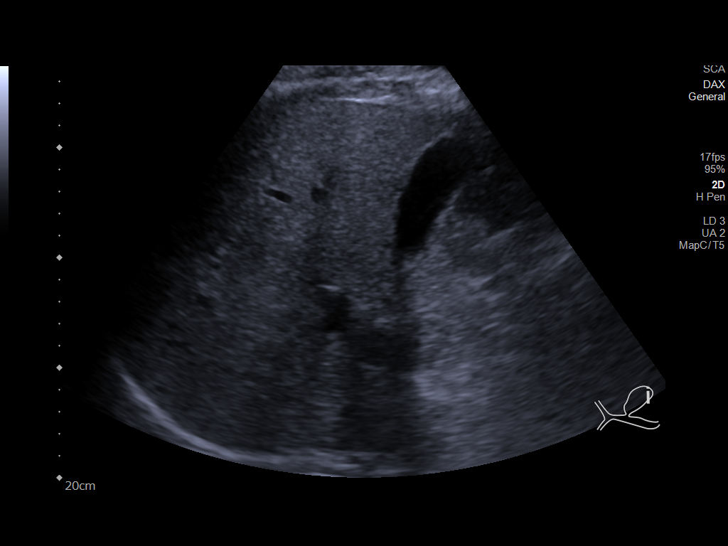
[im 3/31]
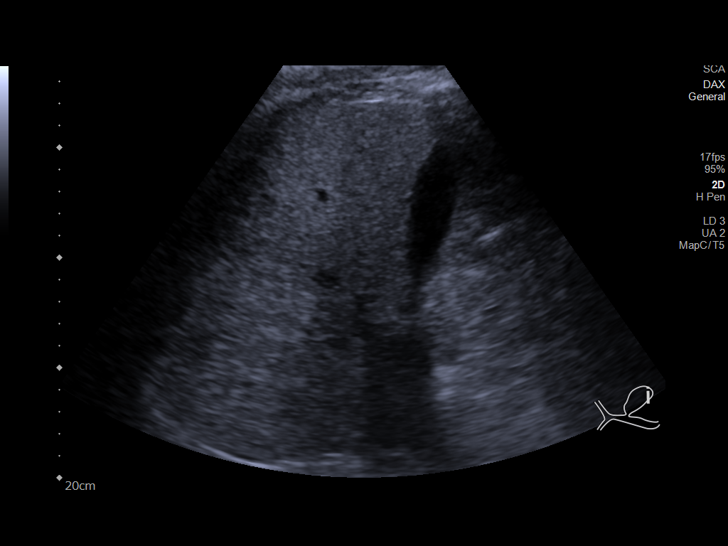
[im 6/31]
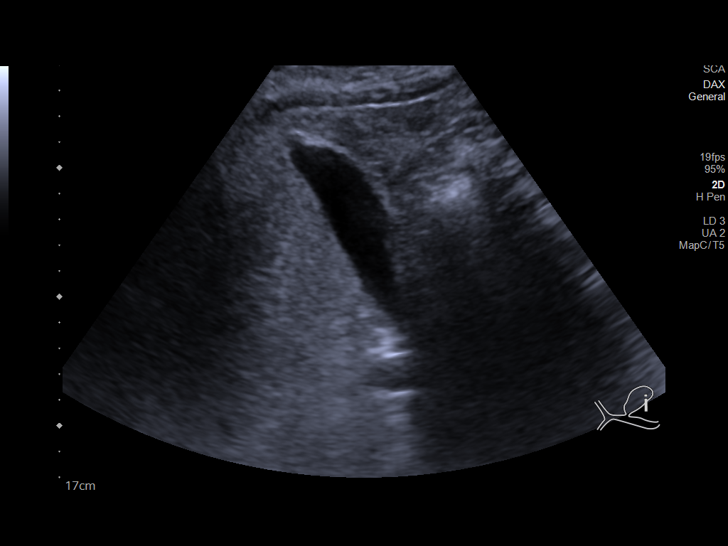
[im 8/31]
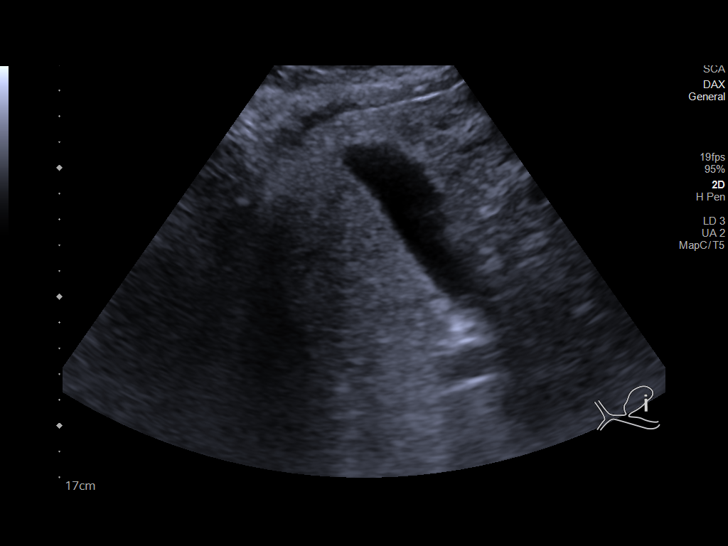
[im 11/31]
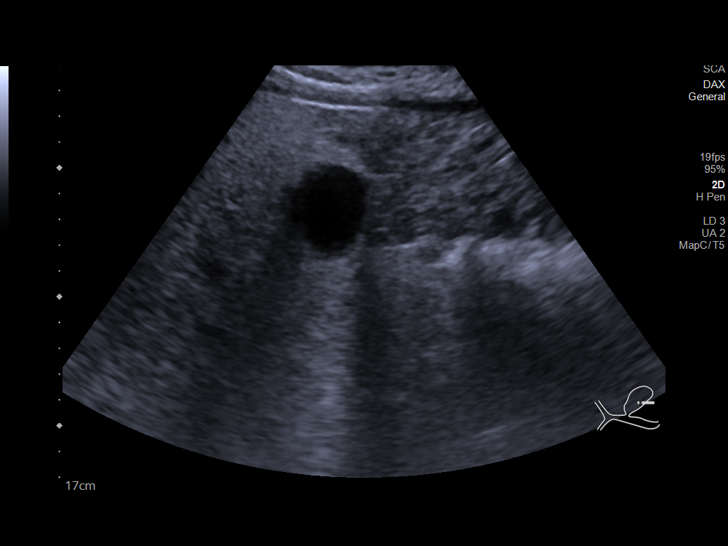
[im 12/31]
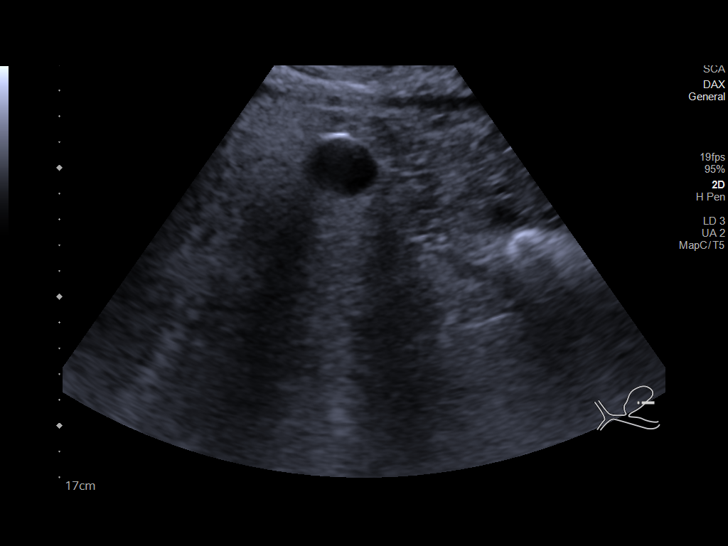
[im 14/31]
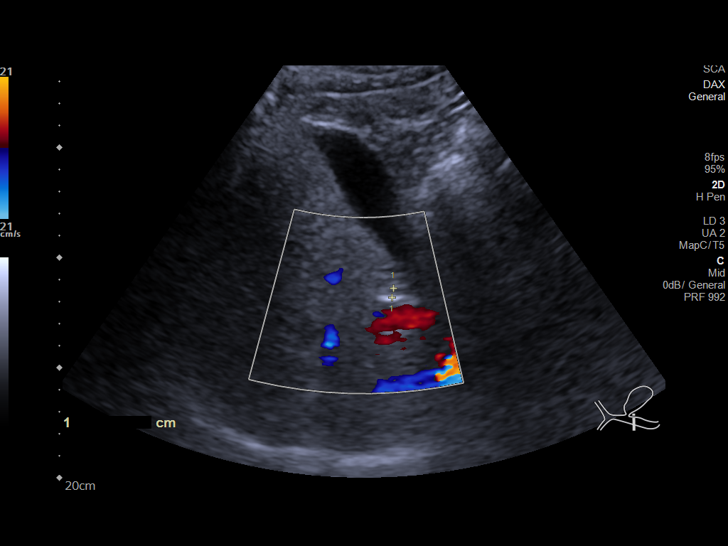
[im 17/31]
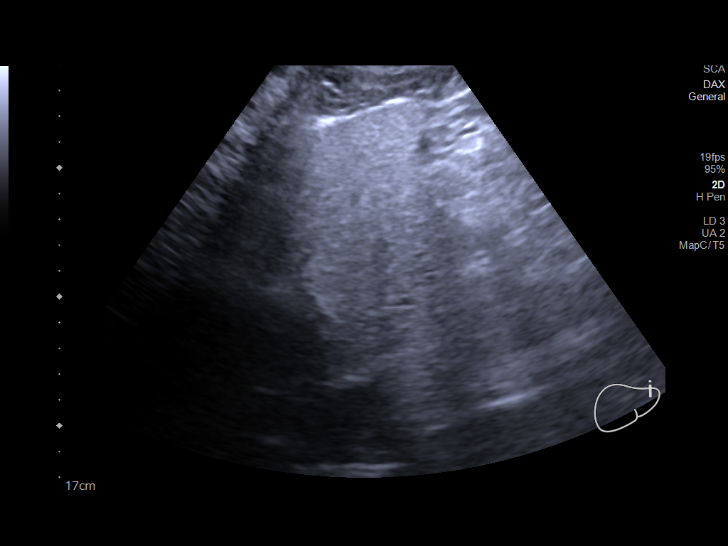
[im 19/31]
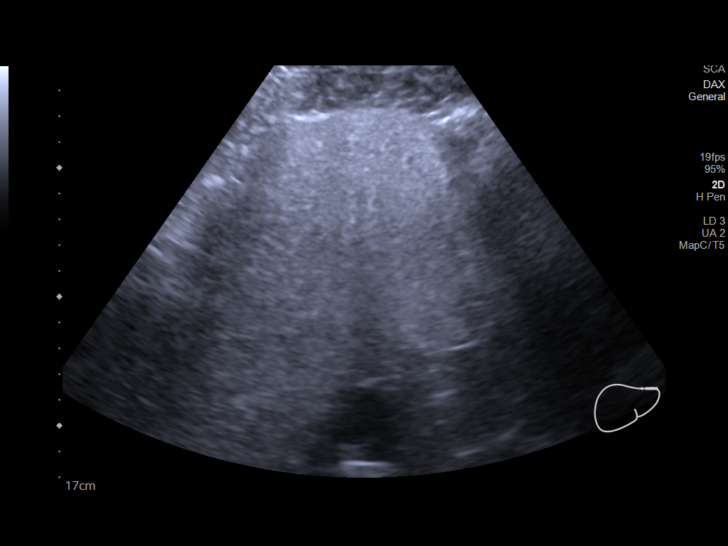
[im 21/31]
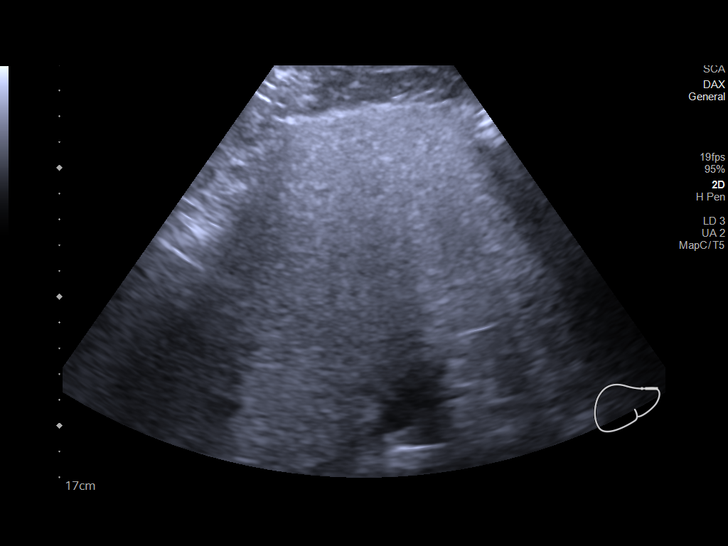
[im 23/31]
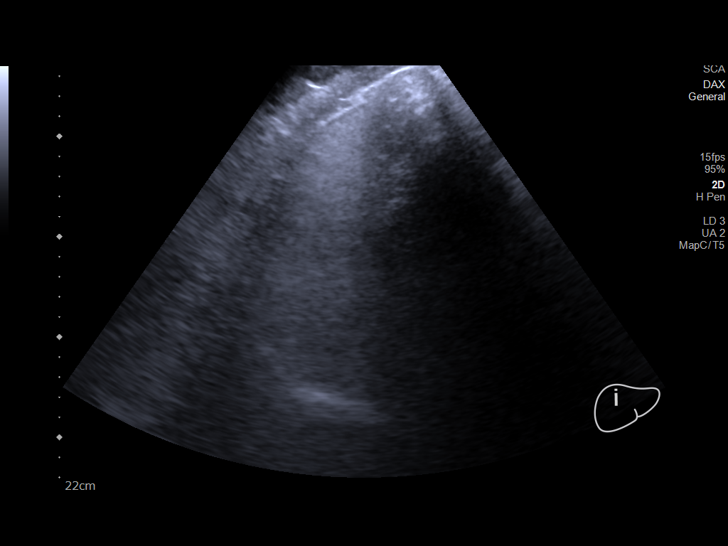
[im 26/31]
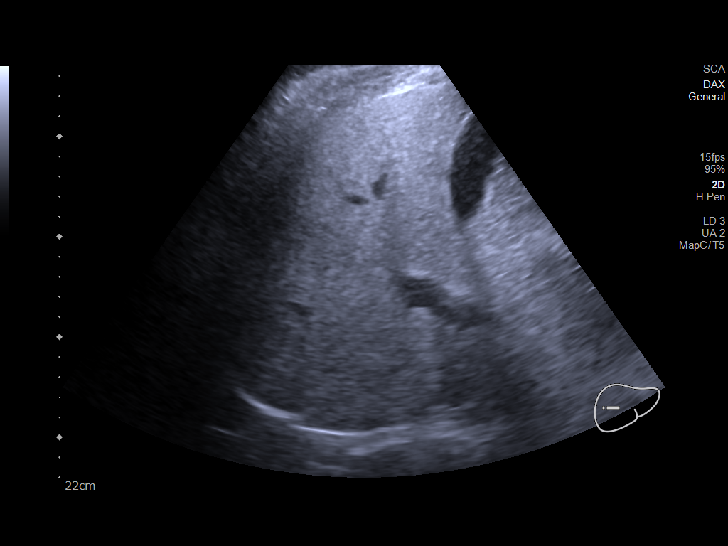
[im 28/31]
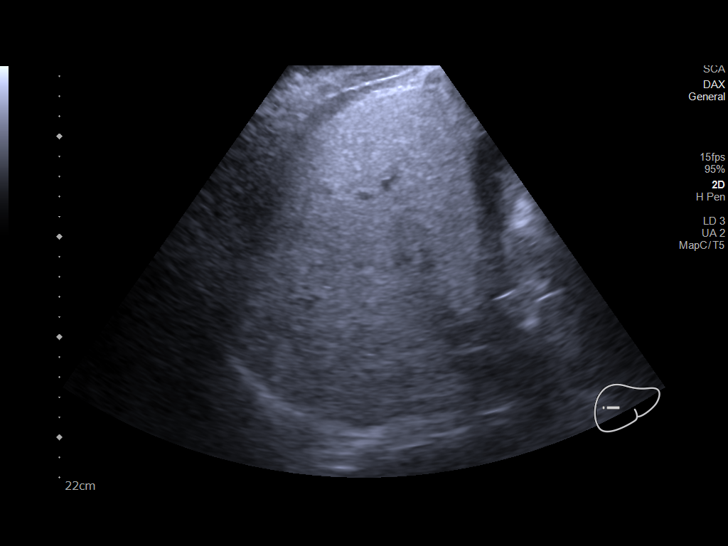
[im 31/31]
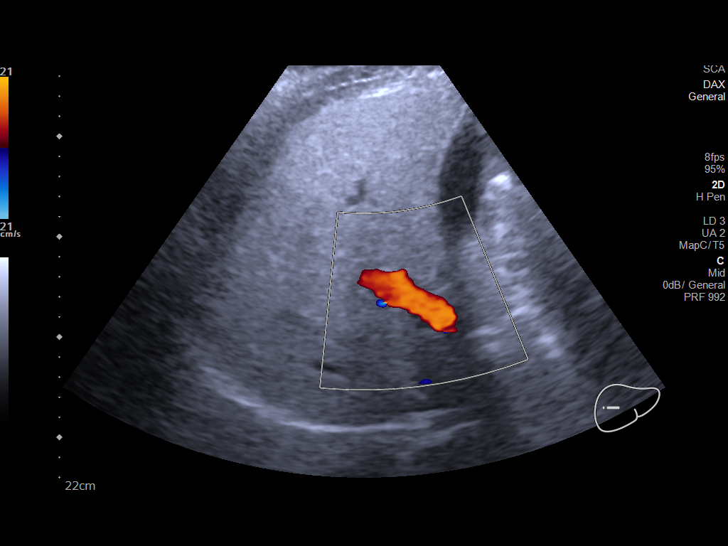

[14 of 25 positions shown; findings below may reference images not displayed]

FINDINGS: Gallbladder:

No gallstones or wall thickening visualized. No sonographic Murphy
sign noted by sonographer.

Common bile duct:

Diameter: 4 mm. Limited coverage of the common bile duct due to
sonographic windows.

Liver:

Diffusely echogenic with central sparing. Portal vein is patent on
color Doppler imaging with normal direction of blood flow towards
the liver.
IMPRESSION: Hepatic steatosis.

## 2023-01-29 ENCOUNTER — Other Ambulatory Visit: Payer: Self-pay | Admitting: Medical

## 2023-02-21 ENCOUNTER — Other Ambulatory Visit: Payer: Self-pay | Admitting: Medical

## 2023-02-21 ENCOUNTER — Other Ambulatory Visit (HOSPITAL_BASED_OUTPATIENT_CLINIC_OR_DEPARTMENT_OTHER): Payer: Self-pay

## 2023-02-21 MED ORDER — TRAMADOL HCL 50 MG PO TABS
50.0000 mg | ORAL_TABLET | Freq: Four times a day (QID) | ORAL | 0 refills | Status: DC | PRN
  Filled 2023-02-21: qty 15, 4d supply, fill #0

## 2023-03-05 ENCOUNTER — Other Ambulatory Visit (HOSPITAL_BASED_OUTPATIENT_CLINIC_OR_DEPARTMENT_OTHER): Payer: Self-pay

## 2023-08-26 IMAGING — DX DG HIP (WITH OR WITHOUT PELVIS) 2-3V*R*
3 series · 3 of 3 positions shown · non-contrast
Comparison: None.

CLINICAL DATA: Posterior hip pain

EXAM:
DG HIP (WITH OR WITHOUT PELVIS) 2-3V RIGHT

[pelvis ap]
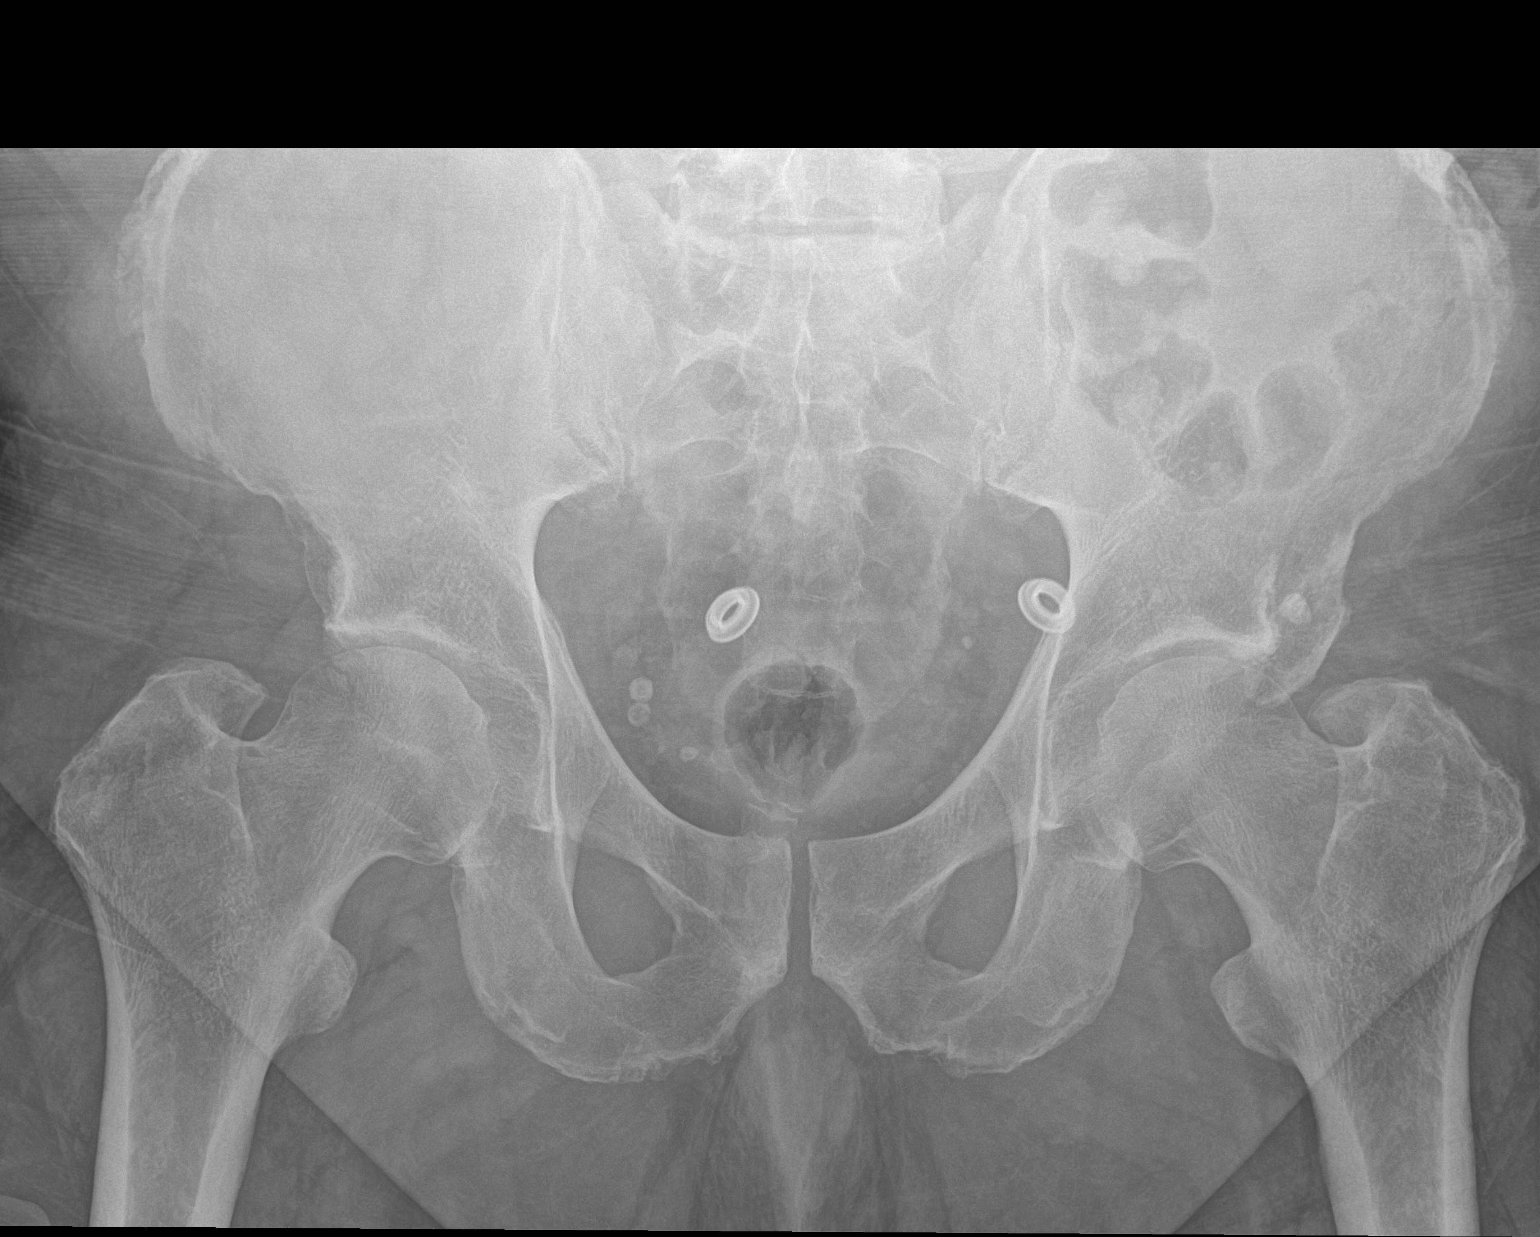

[hip ap]
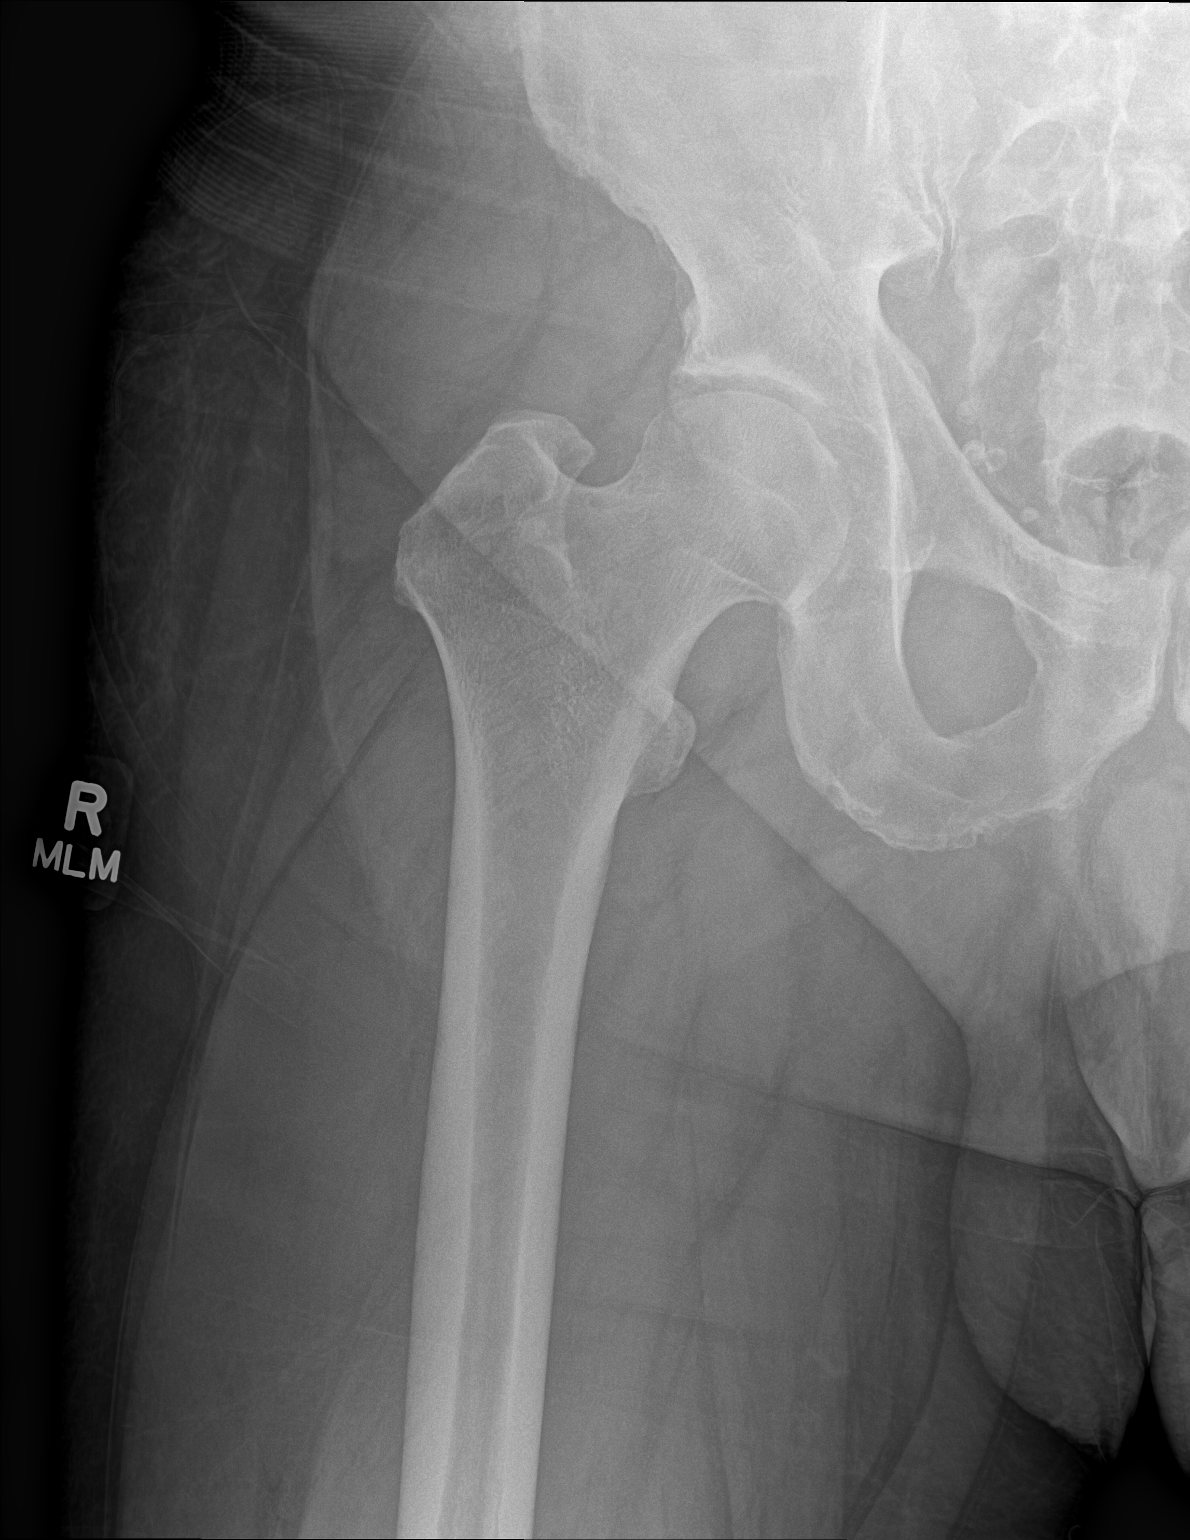

[hip frog leg]
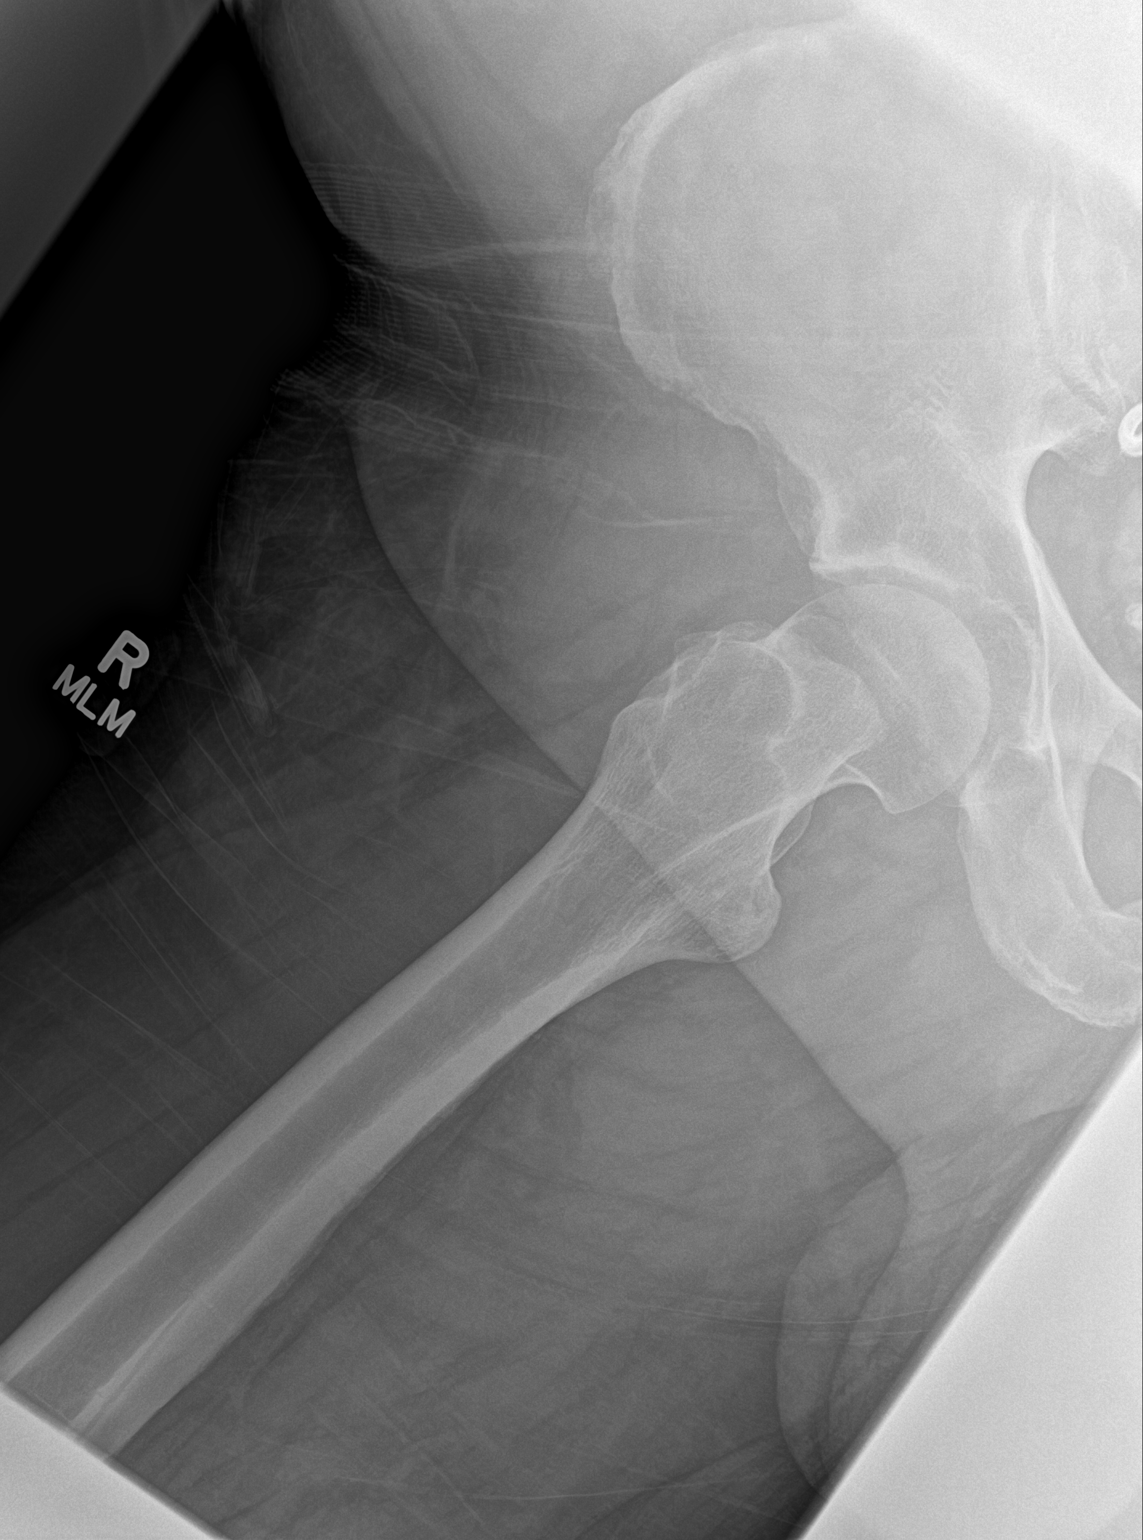

[3 of 3 positions shown; findings below may reference images not displayed]

FINDINGS: There is no evidence of hip fracture or dislocation. There is no
evidence of arthropathy or other focal bone abnormality.
IMPRESSION: Negative.

## 2023-09-13 ENCOUNTER — Other Ambulatory Visit: Payer: Self-pay

## 2023-10-15 ENCOUNTER — Other Ambulatory Visit: Payer: Self-pay | Admitting: Medical

## 2023-10-17 ENCOUNTER — Encounter: Payer: Self-pay | Admitting: Medical

## 2023-10-17 ENCOUNTER — Ambulatory Visit: Admitting: Medical

## 2023-10-17 VITALS — BP 128/83 | HR 78 | Temp 99.0°F | Resp 14 | Ht 73.0 in | Wt 325.8 lb

## 2023-10-17 DIAGNOSIS — F419 Anxiety disorder, unspecified: Secondary | ICD-10-CM | POA: Diagnosis not present

## 2023-10-17 DIAGNOSIS — R739 Hyperglycemia, unspecified: Secondary | ICD-10-CM

## 2023-10-17 DIAGNOSIS — Z125 Encounter for screening for malignant neoplasm of prostate: Secondary | ICD-10-CM

## 2023-10-17 DIAGNOSIS — I10 Essential (primary) hypertension: Secondary | ICD-10-CM

## 2023-10-17 DIAGNOSIS — K219 Gastro-esophageal reflux disease without esophagitis: Secondary | ICD-10-CM

## 2023-10-17 MED ORDER — CITALOPRAM HYDROBROMIDE 20 MG PO TABS: 20.0000 mg | ORAL_TABLET | Freq: Every day | ORAL | 3 refills | Status: AC

## 2023-10-17 MED ORDER — LISINOPRIL 40 MG PO TABS: 40.0000 mg | ORAL_TABLET | Freq: Every day | ORAL | 3 refills | Status: AC

## 2023-10-17 MED ORDER — HYDROCHLOROTHIAZIDE 12.5 MG PO CAPS: 12.5000 mg | ORAL_CAPSULE | Freq: Every day | ORAL | 3 refills | Status: AC

## 2023-10-17 MED ORDER — AMLODIPINE BESYLATE 10 MG PO TABS: 10.0000 mg | ORAL_TABLET | Freq: Every day | ORAL | 3 refills | Status: AC

## 2023-10-17 MED ORDER — FAMOTIDINE 20 MG PO TABS: 20.0000 mg | ORAL_TABLET | Freq: Every day | ORAL | 3 refills | Status: AC

## 2023-10-17 NOTE — Patient Instructions (Addendum)
 Htn- Bp controlled on recheck. Refilled meds today.  Hctz, amlodipine  and zestril   Anxiety well controlled on citalopram . Refilled med today.  Elevated sugar- future cmp  Placed future cmp and lipid to get done within 1-2 weeks.  Added screening psa as well.  Flu vaccine before end of October. Consider Pcv 20 and Shingrix as well.  Thalia- controlled with famotadine  Follow up one year or sooner if needed

## 2023-10-17 NOTE — Progress Notes (Signed)
 Subjective:    Patient ID: Jacob Allison, male    DOB: 08-03-1970, 53 y.o.   MRN: [id number]  HPI Htn- on hctz, amlodipine  and zestril . Recently his mail order pharmacy indicated he needs refills though our records indicated he had refills. No report of any abnormal bp readings. Has been about 2 years since saw pt.   Mild elevated sugar level last year and elevated liver enzymes. Some fatty liver.  Anxiety- controlled with citalopram . Reports no depression  Needs refill of famotadine.    Review of Systems  Constitutional:  Negative for chills, fatigue and fever.  HENT:  Negative for congestion.   Respiratory:  Negative for choking, shortness of breath and wheezing.   Cardiovascular:  Negative for chest pain and palpitations.  Gastrointestinal:  Negative for abdominal pain, blood in stool, diarrhea and nausea.  Genitourinary:  Negative for dysuria.  Musculoskeletal:  Negative for back pain, joint swelling and neck pain.  Neurological:  Negative for dizziness, speech difficulty, weakness and light-headedness.  Hematological:  Negative for adenopathy.  Psychiatric/Behavioral:  Negative for behavioral problems, decreased concentration and dysphoric mood.     Past Medical History:  Diagnosis Date   Anxiety    Hypertension      Social History   Socioeconomic History   Marital status: Married    Spouse name: Not on file   Number of children: Not on file   Years of education: Not on file   Highest education level: Associate degree: occupational, Scientist, product/process development, or vocational program  Occupational History   Occupation: Production designer, theatre/television/film pay checks  Tobacco Use   Smoking status: Some Days    Types: Cigars   Smokeless tobacco: Never  Substance and Sexual Activity   Alcohol use: Yes    Comment: 3 mixed drinks 3-4 times a week.   Drug use: Never   Sexual activity: Yes  Other Topics Concern   Not on file  Social History Narrative   Not on file   Social Drivers of Health   Financial  Resource Strain: Low Risk  (10/16/2023)   Overall Financial Resource Strain (CARDIA)    Difficulty of Paying Living Expenses: Not very hard  Food Insecurity: No Food Insecurity (10/16/2023)   Hunger Vital Sign    Worried About Running Out of Food in the Last Year: Never true    Ran Out of Food in the Last Year: Never true  Transportation Needs: No Transportation Needs (10/16/2023)   PRAPARE - Administrator, Civil Service (Medical): No    Lack of Transportation (Non-Medical): No  Physical Activity: Insufficiently Active (10/16/2023)   Exercise Vital Sign    Days of Exercise per Week: 2 days    Minutes of Exercise per Session: 20 min  Stress: No Stress Concern Present (10/16/2023)   [employer] of Occupational Health - Occupational Stress Questionnaire    Feeling of Stress: Not at all  Social Connections: Moderately Isolated (10/16/2023)   Social Connection and Isolation Panel    Frequency of Communication with Friends and Family: More than three times a week    Frequency of Social Gatherings with Friends and Family: Twice a week    Attends Religious Services: Never    Database administrator or Organizations: No    Attends Banker Meetings: Not on file    Marital Status: Married  Intimate Partner Violence: Not At Risk (12/05/2021)   Received from [facility]   HITS    Over the last 12 months how  often did your partner physically hurt you?: Never    Over the last 12 months how often did your partner insult you or talk down to you?: Never    Over the last 12 months how often did your partner threaten you with physical harm?: Never    Over the last 12 months how often did your partner scream or curse at you?: Never    No past surgical history on file.  No family history on file.  Not on File  Current Outpatient Medications on File Prior to Visit  Medication Sig Dispense Refill   COVID-19 mRNA vaccine, Moderna, 100 MCG/0.5ML injection Inject into the  muscle. 0.25 mL 0   No current facility-administered medications on file prior to visit.    BP 128/83   Pulse 78   Temp 99 F (37.2 C) (Oral)   Resp 14   Ht 6' 1 (1.854 m)   Wt (!) 325 lb 12.8 oz (147.8 kg)   SpO2 97%   BMI 42.98 kg/m        Objective:   Physical Exam  General Mental Status- Alert. General Appearance- Not in acute distress.   Skin General: Color- Normal Color. Moisture- Normal Moisture.  Neck Carotid Arteries- Normal color. Moisture- Normal Moisture. No carotid bruits. No JVD.  Chest and Lung Exam Auscultation: Breath Sounds:-Normal.  Cardiovascular Auscultation:Rythm- Regular. Murmurs & Other Heart Sounds:Auscultation of the heart reveals- No Murmurs.  Abdomen Inspection:-Inspeection Normal. Palpation/Percussion:Note:No mass. Palpation and Percussion of the abdomen reveal- Non Tender, Non Distended + BS, no rebound or guarding.    Neurologic Cranial Nerve exam:- CN III-XII intact(No nystagmus), symmetric smile. Drift Test:- No drift. Romberg Exam:- Negative.  Heal to Toe Gait exam:-Normal. Finger to Nose:- Normal/Intact Strength:- 5/5 equal and symmetric strength both upper and lower extremities.       Assessment & Plan:   Htn- Bp controlled on recheck. Refilled meds today.  Hctz, amlodipine  and zestril   Anxiety well controlled on citalopram . Refilled med today.  Elevated sugar- future cmp  Placed future cmp and lipid to get done within 1-2 weeks.  Added screening psa as well.  Flu vaccine before end of October. Consider Pcv 20 and Shingrix as well.  Thalia- controlled with famotadine  Follow up one year or sooner if needed  Horace Lukas, PA-C

## 2023-10-24 ENCOUNTER — Ambulatory Visit: Payer: Self-pay | Admitting: Medical

## 2023-10-24 ENCOUNTER — Other Ambulatory Visit (INDEPENDENT_AMBULATORY_CARE_PROVIDER_SITE_OTHER)

## 2023-10-24 ENCOUNTER — Other Ambulatory Visit

## 2023-10-24 DIAGNOSIS — Z125 Encounter for screening for malignant neoplasm of prostate: Secondary | ICD-10-CM | POA: Diagnosis not present

## 2023-10-24 DIAGNOSIS — R739 Hyperglycemia, unspecified: Secondary | ICD-10-CM

## 2023-10-24 DIAGNOSIS — I10 Essential (primary) hypertension: Secondary | ICD-10-CM

## 2023-10-24 LAB — PSA: PSA: 1.73 ng/mL (ref 0.10–4.00)

## 2023-10-24 LAB — COMPREHENSIVE METABOLIC PANEL WITH GFR
ALT: 42 U/L (ref 0–53)
AST: 29 U/L (ref 0–37)
Albumin: 4.2 g/dL (ref 3.5–5.2)
Alkaline Phosphatase: 45 U/L (ref 39–117)
BUN: 14 mg/dL (ref 6–23)
CO2: 29 meq/L (ref 19–32)
Calcium: 8.9 mg/dL (ref 8.4–10.5)
Chloride: 100 meq/L (ref 96–112)
Creatinine, Ser: 0.91 mg/dL (ref 0.40–1.50)
GFR: 95.45 mL/min (ref >60.00)
Glucose, Bld: 127 mg/dL — ABNORMAL HIGH (ref 70–99)
Potassium: 3.6 meq/L (ref 3.5–5.1)
Sodium: 137 meq/L (ref 135–145)
Total Bilirubin: 0.8 mg/dL (ref 0.2–1.2)
Total Protein: 6.8 g/dL (ref 6.0–8.3)

## 2023-10-24 LAB — LIPID PANEL
Cholesterol: 145 mg/dL (ref 0–200)
HDL: 43 mg/dL (ref >39.00)
LDL Cholesterol: 80 mg/dL (ref 0–99)
NonHDL: 101.99
Total CHOL/HDL Ratio: 3
Triglycerides: 111 mg/dL (ref 0.0–149.0)
VLDL: 22.2 mg/dL (ref 0.0–40.0)

## 2023-10-24 LAB — HEMOGLOBIN A1C: Hgb A1c MFr Bld: 6.6 % — ABNORMAL HIGH (ref 4.6–6.5)

## 2024-01-28 ENCOUNTER — Telehealth: Payer: Self-pay

## 2024-01-28 ENCOUNTER — Ambulatory Visit: Admitting: Medical

## 2024-01-28 NOTE — Telephone Encounter (Signed)
 Called pt and confirmed that it is ok to do labs the same day as long as he is fasting. He was understanding   Copied from CRM [id number]. Topic: Clinical - Request for Lab/Test Order >> Jan 28, 2024  8:27 AM Jeoffrey H wrote: Reason for CRM: Patient stated his provider normally wants him to do labs days after his appointment but he wants to do them the say day/time as his appointment instead of coming back. Please call patient to confirm if he needs labs at his appt schd 01/07.    [phone]

## 2024-01-30 ENCOUNTER — Ambulatory Visit: Admitting: Medical

## 2024-01-30 ENCOUNTER — Encounter: Payer: Self-pay | Admitting: Medical

## 2024-01-30 VITALS — BP 130/82 | HR 68 | Temp 97.9°F | Resp 14 | Ht 72.0 in | Wt 320.6 lb

## 2024-01-30 DIAGNOSIS — I10 Essential (primary) hypertension: Secondary | ICD-10-CM | POA: Diagnosis not present

## 2024-01-30 DIAGNOSIS — Z7984 Long term (current) use of oral hypoglycemic drugs: Secondary | ICD-10-CM

## 2024-01-30 DIAGNOSIS — E119 Type 2 diabetes mellitus without complications: Secondary | ICD-10-CM | POA: Diagnosis not present

## 2024-01-30 LAB — MICROALBUMIN / CREATININE URINE RATIO
Creatinine,U: 92.6 mg/dL
Microalb Creat Ratio: UNDETERMINED mg/g (ref 0.0–30.0)
Microalb, Ur: <0.7 mg/dL

## 2024-01-30 LAB — HEMOGLOBIN A1C: Hgb A1c MFr Bld: 6.3 % (ref 4.6–6.5)

## 2024-01-30 NOTE — Patient Instructions (Signed)
 Type 2 diabetes mellitus Recent average blood glucose of 145 mg/dL. A1c above 6.5% indicates diabetes. Kidney function normal. - Ordered A1c test. - Ordered urine microalbumin test. - Consider metformin if A1c remains elevated. - Discussed GLP-1 receptor agonists for weight loss and glycemic control if A1c is above 6.5%.  Essential hypertension Blood pressure 130/82 mmHg. Managed with amlodipine . - Continue current bp meds.  schedule nurse visit flu vaccine this friday as we discussed.  Follow up date to be determined after lab review.

## 2024-01-30 NOTE — Progress Notes (Signed)
 "  Subjective:    Patient ID: Jacob Allison, male    DOB: 1970/07/21, 54 y.o.   MRN: [id number]  HPI  Hashem Goynes is a 54 year old male with diabetes and hypertension who presents for follow-up of his blood sugar and blood pressure management.  His average blood sugar about three months ago was 145 mg/dL, and his prior J8r was 6.5%. He is working on reducing dietary sugar and processed carbohydrates and uses sugar-free beverages. He walks about twice weekly for 20 to 25 minutes, about a mile each time. His blood pressure is treated with amlodipine ,lisinopril  and hctz. He has no history of pancreatitis and no family history of thyroid cancer. He fasted today for blood tests including A1c.      Review of Systems  Constitutional:  Negative for chills, fatigue and fever.  HENT:  Negative for congestion.   Respiratory:  Negative for choking, shortness of breath and wheezing.   Cardiovascular:  Negative for chest pain and palpitations.  Gastrointestinal:  Negative for abdominal pain, blood in stool, diarrhea and nausea.  Genitourinary:  Negative for dysuria.  Musculoskeletal:  Negative for back pain, joint swelling and neck pain.  Neurological:  Negative for dizziness, speech difficulty, weakness and light-headedness.  Hematological:  Negative for adenopathy.  Psychiatric/Behavioral:  Negative for behavioral problems, decreased concentration and dysphoric mood.       Past Medical History:  Diagnosis Date   Anxiety    Hypertension      Social History   Socioeconomic History   Marital status: Married    Spouse name: Not on file   Number of children: Not on file   Years of education: Not on file   Highest education level: Some college, no degree  Occupational History   Occupation: production designer, theatre/television/film pay checks  Tobacco Use   Smoking status: Some Days    Types: Cigars   Smokeless tobacco: Never  Substance and Sexual Activity   Alcohol use: Yes    Comment: 3 mixed drinks 3-4 times a week.    Drug use: Never   Sexual activity: Yes  Other Topics Concern   Not on file  Social History Narrative   Not on file   Social Drivers of Health   Tobacco Use: High Risk (01/30/2024)   Patient History    Smoking Tobacco Use: Some Days    Smokeless Tobacco Use: Never    Passive Exposure: Not on file  Financial Resource Strain: Low Risk (01/23/2024)   Overall Financial Resource Strain (CARDIA)    Difficulty of Paying Living Expenses: Not very hard  Food Insecurity: No Food Insecurity (01/23/2024)   Epic    Worried About Radiation Protection Practitioner of Food in the Last Year: Never true    Ran Out of Food in the Last Year: Never true  Transportation Needs: No Transportation Needs (01/23/2024)   Epic    Lack of Transportation (Medical): No    Lack of Transportation (Non-Medical): No  Physical Activity: Insufficiently Active (01/23/2024)   Exercise Vital Sign    Days of Exercise per Week: 2 days    Minutes of Exercise per Session: 20 min  Stress: No Stress Concern Present (01/23/2024)   [employer] of Occupational Health - Occupational Stress Questionnaire    Feeling of Stress: Not at all  Social Connections: Moderately Isolated (01/23/2024)   Social Connection and Isolation Panel    Frequency of Communication with Friends and Family: More than three times a week    Frequency of Social Gatherings  with Friends and Family: More than three times a week    Attends Religious Services: Never    Active Member of Clubs or Organizations: No    Attends Banker Meetings: Not on file    Marital Status: Married  Catering Manager Violence: Not At Risk (12/05/2021)   Received from [facility]   HITS    Over the last 12 months how often did your partner physically hurt you?: Never    Over the last 12 months how often did your partner insult you or talk down to you?: Never    Over the last 12 months how often did your partner threaten you with physical harm?: Never    Over the last 12 months  how often did your partner scream or curse at you?: Never  Depression (PHQ2-9): Low Risk (01/30/2024)   Depression (PHQ2-9)    PHQ-2 Score: 0  Alcohol Screen: Low Risk (01/23/2024)   Alcohol Screen    Last Alcohol Screening Score (AUDIT): 5  Housing: Low Risk (01/23/2024)   Epic    Unable to Pay for Housing in the Last Year: No    Number of Times Moved in the Last Year: 0    Homeless in the Last Year: No  Utilities: Not on file  Health Literacy: Not on file    No past surgical history on file.  No family history on file.  Allergies[1]  Medications Ordered Prior to Encounter[2]  BP 130/82 (BP Location: Left Arm, Patient Position: Sitting, Cuff Size: Large)   Pulse 68   Temp 97.9 F (36.6 C) (Oral)   Resp 14   Ht 6' (1.829 m)   Wt (!) 320 lb 9.6 oz (145.4 kg)   SpO2 94%   BMI 43.48 kg/m         Objective:   Physical Exam  General- No acute distress. Pleasant patient. Neck- Full range of motion, no jvd Lungs- Clear, even and unlabored. Heart- regular rate and rhythm. Neurologic- CNII- XII grossly intact.       Assessment & Plan:   Type 2 diabetes mellitus Recent average blood glucose of 145 mg/dL. A1c above 6.5% indicates diabetes. Kidney function normal. - Ordered A1c test. - Ordered urine microalbumin test. - Consider metformin if A1c remains elevated. - Discussed GLP-1 receptor agonists for weight loss and glycemic control if A1c is above 6.5%.  Essential hypertension Blood pressure 130/82 mmHg. Managed with amlodipine . - Continue current bp meds.  schedule nurse visit flu vaccine this friday as we discussed.  Follow up date to be determined after lab review.   Dallas Maxwell, PA-C     [1] Not on File [2]  Current Outpatient Medications on File Prior to Visit  Medication Sig Dispense Refill   amLODipine  (NORVASC ) 10 MG tablet Take 1 tablet (10 mg total) by mouth daily. 90 tablet 3   citalopram  (CELEXA ) 20 MG tablet Take 1 tablet (20 mg total)  by mouth daily. 90 tablet 3   COVID-19 mRNA vaccine, Moderna, 100 MCG/0.5ML injection Inject into the muscle. 0.25 mL 0   famotidine  (PEPCID ) 20 MG tablet Take 1 tablet (20 mg total) by mouth daily. 90 tablet 3   hydrochlorothiazide  (MICROZIDE ) 12.5 MG capsule Take 1 capsule (12.5 mg total) by mouth daily. 90 capsule 3   lisinopril  (ZESTRIL ) 40 MG tablet Take 1 tablet (40 mg total) by mouth daily. 90 tablet 3   No current facility-administered medications on file prior to visit.   "

## 2024-01-31 ENCOUNTER — Ambulatory Visit: Payer: Self-pay | Admitting: Medical

## 2024-02-01 MED ORDER — METFORMIN HCL 500 MG PO TABS: 500.0000 mg | ORAL_TABLET | Freq: Two times a day (BID) | ORAL | 3 refills | Status: AC

## 2024-02-01 NOTE — Addendum Note (Signed)
 Addended by: DORINA DALLAS DORINA PA-C M on: 02/01/2024 01:02 PM   Modules accepted: Orders
